# Patient Record
Sex: Male | Born: 1965 | Race: White | Hispanic: No | Marital: Married | Smoking: Former smoker
Health system: Southern US, Community
[De-identification: ages and names within clinical notes are randomized; demographics above are authoritative.]

## PROBLEM LIST (undated history)

## (undated) DIAGNOSIS — F329 Major depressive disorder, single episode, unspecified: Secondary | ICD-10-CM

## (undated) DIAGNOSIS — E7800 Pure hypercholesterolemia, unspecified: Secondary | ICD-10-CM

## (undated) DIAGNOSIS — I10 Essential (primary) hypertension: Secondary | ICD-10-CM

## (undated) DIAGNOSIS — N401 Enlarged prostate with lower urinary tract symptoms: Secondary | ICD-10-CM

## (undated) DIAGNOSIS — N138 Other obstructive and reflux uropathy: Secondary | ICD-10-CM

## (undated) DIAGNOSIS — Z87442 Personal history of urinary calculi: Secondary | ICD-10-CM

## (undated) DIAGNOSIS — F32A Depression, unspecified: Secondary | ICD-10-CM

## (undated) HISTORY — PX: KIDNEY STONE SURGERY: SHX686

## (undated) HISTORY — PX: NO PAST SURGERIES: SHX2092

---

## 2010-07-09 ENCOUNTER — Emergency Department: Payer: Self-pay | Admitting: Emergency Medicine

## 2012-12-22 ENCOUNTER — Emergency Department: Payer: Self-pay | Admitting: Emergency Medicine

## 2012-12-22 LAB — BASIC METABOLIC PANEL
Anion Gap: 9 (ref 7–16)
BUN: 15 mg/dL (ref 7–18)
Calcium, Total: 9 mg/dL (ref 8.5–10.1)
Chloride: 105 mmol/L (ref 98–107)
Co2: 23 mmol/L (ref 21–32)
Glucose: 114 mg/dL — ABNORMAL HIGH (ref 65–99)
Osmolality: 276 (ref 275–301)
Potassium: 4 mmol/L (ref 3.5–5.1)
Sodium: 137 mmol/L (ref 136–145)

## 2012-12-22 LAB — CBC WITH DIFFERENTIAL/PLATELET
Basophil #: 0.1 10*3/uL (ref 0.0–0.1)
Basophil %: 0.6 %
Eosinophil %: 3.1 %
HCT: 48.7 % (ref 40.0–52.0)
Lymphocyte #: 2.4 10*3/uL (ref 1.0–3.6)
MCH: 29.1 pg (ref 26.0–34.0)
MCHC: 34.3 g/dL (ref 32.0–36.0)
MCV: 85 fL (ref 80–100)
Monocyte #: 0.9 x10 3/mm (ref 0.2–1.0)
Monocyte %: 10.6 %
Neutrophil %: 58.6 %
Platelet: 242 10*3/uL (ref 150–440)
RDW: 13.2 % (ref 11.5–14.5)
WBC: 8.9 10*3/uL (ref 3.8–10.6)

## 2012-12-22 LAB — URINALYSIS, COMPLETE
Blood: NEGATIVE
Glucose,UR: NEGATIVE mg/dL (ref 0–75)
Ketone: NEGATIVE
Leukocyte Esterase: NEGATIVE
Nitrite: NEGATIVE
RBC,UR: 2 /HPF (ref 0–5)
Squamous Epithelial: <1
WBC UR: 1 /HPF (ref 0–5)

## 2012-12-22 LAB — CK TOTAL AND CKMB (NOT AT ARMC): CK-MB: 2.7 ng/mL (ref 0.5–3.6)

## 2012-12-22 LAB — TROPONIN I: Troponin-I: <0.02 ng/mL

## 2012-12-23 LAB — DRUG SCREEN, URINE
Cannabinoid 50 Ng, Ur ~~LOC~~: POSITIVE (ref ?–50)
MDMA (Ecstasy)Ur Screen: NEGATIVE (ref ?–500)
Methadone, Ur Screen: NEGATIVE (ref ?–300)
Opiate, Ur Screen: NEGATIVE (ref ?–300)
Phencyclidine (PCP) Ur S: NEGATIVE (ref ?–25)
Tricyclic, Ur Screen: NEGATIVE (ref ?–1000)

## 2014-01-06 ENCOUNTER — Emergency Department (HOSPITAL_COMMUNITY): Payer: 59

## 2014-01-06 ENCOUNTER — Encounter (HOSPITAL_COMMUNITY): Payer: Self-pay | Admitting: *Deleted

## 2014-01-06 ENCOUNTER — Emergency Department (HOSPITAL_COMMUNITY)
Admission: EM | Admit: 2014-01-06 | Discharge: 2014-01-06 | Disposition: A | Discharge status: Home / Self Care | Payer: 59 | Attending: Emergency Medicine | Admitting: Emergency Medicine

## 2014-01-06 DIAGNOSIS — Z8639 Personal history of other endocrine, nutritional and metabolic disease: Secondary | ICD-10-CM | POA: Diagnosis not present

## 2014-01-06 DIAGNOSIS — Z8659 Personal history of other mental and behavioral disorders: Secondary | ICD-10-CM | POA: Diagnosis not present

## 2014-01-06 DIAGNOSIS — Y998 Other external cause status: Secondary | ICD-10-CM | POA: Diagnosis not present

## 2014-01-06 DIAGNOSIS — Z87891 Personal history of nicotine dependence: Secondary | ICD-10-CM | POA: Diagnosis not present

## 2014-01-06 DIAGNOSIS — I10 Essential (primary) hypertension: Secondary | ICD-10-CM | POA: Insufficient documentation

## 2014-01-06 DIAGNOSIS — W01198A Fall on same level from slipping, tripping and stumbling with subsequent striking against other object, initial encounter: Secondary | ICD-10-CM | POA: Diagnosis not present

## 2014-01-06 DIAGNOSIS — Y9389 Activity, other specified: Secondary | ICD-10-CM | POA: Insufficient documentation

## 2014-01-06 DIAGNOSIS — Z87448 Personal history of other diseases of urinary system: Secondary | ICD-10-CM | POA: Insufficient documentation

## 2014-01-06 DIAGNOSIS — Y92015 Private garage of single-family (private) house as the place of occurrence of the external cause: Secondary | ICD-10-CM | POA: Insufficient documentation

## 2014-01-06 DIAGNOSIS — S0990XA Unspecified injury of head, initial encounter: Secondary | ICD-10-CM | POA: Diagnosis present

## 2014-01-06 HISTORY — DX: Depression, unspecified: F32.A

## 2014-01-06 HISTORY — DX: Major depressive disorder, single episode, unspecified: F32.9

## 2014-01-06 HISTORY — DX: Benign prostatic hyperplasia with lower urinary tract symptoms: N13.8

## 2014-01-06 HISTORY — DX: Benign prostatic hyperplasia with lower urinary tract symptoms: N40.1

## 2014-01-06 HISTORY — DX: Pure hypercholesterolemia, unspecified: E78.00

## 2014-01-06 HISTORY — DX: Essential (primary) hypertension: I10

## 2014-01-06 MED ORDER — ONDANSETRON HCL 4 MG/2ML IJ SOLN
4.0000 mg | Freq: Once | INTRAMUSCULAR | Status: AC
  Administered 2014-01-06: 4 mg via INTRAVENOUS
  Filled 2014-01-06: qty 2

## 2014-01-06 MED ORDER — OXYCODONE-ACETAMINOPHEN 5-325 MG PO TABS: 1.0000 | ORAL_TABLET | ORAL | Status: DC | PRN

## 2014-01-06 MED ORDER — FENTANYL CITRATE 0.05 MG/ML IJ SOLN
50.0000 ug | INTRAMUSCULAR | Status: DC | PRN
  Administered 2014-01-06 (×3): 50 ug via INTRAVENOUS
  Filled 2014-01-06 (×3): qty 2

## 2014-01-06 NOTE — ED Notes (Addendum)
Pt arrives via POV driven by wife. States that he fell about 5 ft down off of a refrigerator. States he was sitting on the fridge, he leaned back and the door of the fridge opened and he fell and hit the left side of his head on the cement garage floor. pts wife states that he did not want 911 called. Pt c/o head pain, blurry vision and dizziness. Also c/o jaw pain. c-collar applied. Dr Eulis Foster notified.

## 2014-01-06 NOTE — ED Notes (Signed)
Pt monitored by pulse ox, bp cuff, and 12-lead. 

## 2014-01-06 NOTE — ED Notes (Signed)
Dr. Wentz at bedside. 

## 2014-01-06 NOTE — ED Provider Notes (Signed)
CSN: [id number]     Arrival date & time 01/06/14  1548 History   First MD Initiated Contact with Patient 01/06/14 1558     Chief Complaint  Patient presents with  . Fall     (Consider location/radiation/quality/duration/timing/severity/associated sxs/prior Treatment) Patient is a 48 y.o. male presenting with fall. The history is provided by the patient and the spouse.  Fall  Matthew Small is a 47 y.o. male who was on top of the refrigerator, feet, about 5 feet off the ground, when he accidentally slipped and fell, striking his head on the ground.  He does not feel he lost consciousness.  His wife heard a commotion, came to him and found him awake, but needing help getting up.  He was a with a meal afterwards.  He currently complains of pain in his left temple.  He has had some blurred vision.  He denies nausea, vomiting, neck pain, back pain, weakness or dizziness.  There are no other known modifying factors.    Past Medical History  Diagnosis Date  . Hypertension   . Hypercholesteremia   . Depression   . Prostate hyperplasia with urinary obstruction    History reviewed. No pertinent past surgical history. No family history on file. History  Substance Use Topics  . Smoking status: Former Smoker    Types: Cigarettes    Quit date: 01/07/1984  . Smokeless tobacco: Never Used  . Alcohol Use: 6.6 oz/week    8 Cans of beer, 3 Shots of liquor per week    Review of Systems  All other systems reviewed and are negative.     Allergies  Review of patient's allergies indicates no known allergies.  Home Medications   Prior to Admission medications   Not on File   BP 142/88 mmHg  Pulse 71  Temp(Src) 98.9 F (37.2 C) (Oral)  Resp 24  Ht 5\' 5"  (1.651 m)  Wt 195 lb (88.451 kg)  BMI 32.45 kg/m2  SpO2 93% Physical Exam  Constitutional: He is oriented to person, place, and time. He appears well-developed and well-nourished.  HENT:  Head: Normocephalic.  Right Ear: External  ear normal.  Left Ear: External ear normal.  Small abrasion left frontal scalp, with mild diffuse left frontal scalp tenderness.  No crepitation or deformity.  Eyes: Conjunctivae and EOM are normal. Pupils are equal, round, and reactive to light.  Neck: Normal range of motion and phonation normal. Neck supple.  Cardiovascular: Normal rate, regular rhythm and normal heart sounds.   Pulmonary/Chest: Effort normal and breath sounds normal. He exhibits no bony tenderness.  Abdominal: Soft. There is no tenderness.  Musculoskeletal: Normal range of motion.  Neurological: He is alert and oriented to person, place, and time. No cranial nerve deficit or sensory deficit. He exhibits normal muscle tone. Coordination normal.  Skin: Skin is warm, dry and intact.  Psychiatric: He has a normal mood and affect. His behavior is normal. Judgment and thought content normal.  Nursing note and vitals reviewed.   ED Course  Procedures (including critical care time)  Medications  fentaNYL (SUBLIMAZE) injection 50 mcg (50 mcg Intravenous Given 01/06/14 1652)  ondansetron (ZOFRAN) injection 4 mg (4 mg Intravenous Given 01/06/14 1606)    Patient Vitals for the past 24 hrs:  BP Temp Temp src Pulse Resp SpO2 Height Weight  01/06/14 1615 142/88 mmHg - - 71 24 93 % - -  01/06/14 1600 146/94 mmHg - - 81 16 97 % - -  01/06/14 1553 Marland Kitchen)  161/110 mmHg 98.9 F (37.2 C) Oral 89 14 98 % 5\' 5"  (1.651 m) 195 lb (88.451 kg)    5:29 PM Reevaluation with update and discussion. After initial assessment and treatment, an updated evaluation reveals no further c/o.  Pain improved after second treatment with fentanyl.  Findings discussed with patient, and wife, questions answered. . [place] Review Labs Reviewed - No data to display  Imaging Review No results found.   EKG Interpretation   Date/Time:  Sunday January 06 2014 15:54:40 EST Ventricular Rate:  84 PR Interval:  159 QRS Duration: 99 QT  Interval:  419 QTC Calculation: 495 R Axis:   52 Text Interpretation:  Sinus rhythm Ventricular premature complex  Borderline prolonged QT interval No old tracing to compare Confirmed by  [facility]  MD, Eulan Heyward [phone]) on 01/06/2014 3:58:14 PM      MDM   Final diagnoses:  Head injury, initial encounter    Fall with head injury, isolated trauma.  Possible mild concussion, without serious injury.  Nursing Notes Reviewed/ Care Coordinated Applicable Imaging Reviewed Interpretation of Laboratory Data incorporated into ED treatment  The patient appears reasonably screened and/or stabilized for discharge and I doubt any other medical condition or other [facility] requiring further screening, evaluation, or treatment in the ED at this time prior to discharge.  Plan: Home Medications- Percocet; Home Treatments- rest; return here if the recommended treatment, does not improve the symptoms; Recommended follow up- PCP prn   Richarda Blade, MD 01/06/14 1746

## 2014-01-06 NOTE — Discharge Instructions (Signed)

## 2014-01-07 ENCOUNTER — Emergency Department (HOSPITAL_COMMUNITY)
Admission: EM | Admit: 2014-01-07 | Discharge: 2014-01-08 | Disposition: A | Discharge status: Home / Self Care | Payer: 59 | Attending: Emergency Medicine | Admitting: Emergency Medicine

## 2014-01-07 ENCOUNTER — Emergency Department (HOSPITAL_COMMUNITY): Payer: 59

## 2014-01-07 ENCOUNTER — Encounter (HOSPITAL_COMMUNITY): Payer: Self-pay

## 2014-01-07 DIAGNOSIS — F0781 Postconcussional syndrome: Secondary | ICD-10-CM | POA: Insufficient documentation

## 2014-01-07 DIAGNOSIS — Z87891 Personal history of nicotine dependence: Secondary | ICD-10-CM | POA: Diagnosis not present

## 2014-01-07 DIAGNOSIS — F329 Major depressive disorder, single episode, unspecified: Secondary | ICD-10-CM | POA: Diagnosis not present

## 2014-01-07 DIAGNOSIS — W19XXXA Unspecified fall, initial encounter: Secondary | ICD-10-CM

## 2014-01-07 DIAGNOSIS — Y998 Other external cause status: Secondary | ICD-10-CM | POA: Diagnosis not present

## 2014-01-07 DIAGNOSIS — I10 Essential (primary) hypertension: Secondary | ICD-10-CM | POA: Diagnosis not present

## 2014-01-07 DIAGNOSIS — R51 Headache: Secondary | ICD-10-CM | POA: Insufficient documentation

## 2014-01-07 DIAGNOSIS — Z79899 Other long term (current) drug therapy: Secondary | ICD-10-CM | POA: Insufficient documentation

## 2014-01-07 DIAGNOSIS — W1789XD Other fall from one level to another, subsequent encounter: Secondary | ICD-10-CM | POA: Insufficient documentation

## 2014-01-07 DIAGNOSIS — S8001XA Contusion of right knee, initial encounter: Secondary | ICD-10-CM | POA: Insufficient documentation

## 2014-01-07 DIAGNOSIS — Z87438 Personal history of other diseases of male genital organs: Secondary | ICD-10-CM | POA: Diagnosis not present

## 2014-01-07 DIAGNOSIS — S8002XA Contusion of left knee, initial encounter: Secondary | ICD-10-CM | POA: Insufficient documentation

## 2014-01-07 DIAGNOSIS — E781 Pure hyperglyceridemia: Secondary | ICD-10-CM | POA: Insufficient documentation

## 2014-01-07 DIAGNOSIS — Y9389 Activity, other specified: Secondary | ICD-10-CM | POA: Diagnosis not present

## 2014-01-07 DIAGNOSIS — Y92015 Private garage of single-family (private) house as the place of occurrence of the external cause: Secondary | ICD-10-CM | POA: Diagnosis not present

## 2014-01-07 LAB — CBC WITH DIFFERENTIAL/PLATELET
BASOS PCT: 0 % (ref 0–1)
Basophils Absolute: 0 10*3/uL (ref 0.0–0.1)
Eosinophils Absolute: 0.2 10*3/uL (ref 0.0–0.7)
Eosinophils Relative: 2 % (ref 0–5)
HEMATOCRIT: 47.6 % (ref 39.0–52.0)
Hemoglobin: 16.2 g/dL (ref 13.0–17.0)
LYMPHS ABS: 1.6 10*3/uL (ref 0.7–4.0)
LYMPHS PCT: 16 % (ref 12–46)
MCH: 29.1 pg (ref 26.0–34.0)
MCHC: 34 g/dL (ref 30.0–36.0)
MCV: 85.6 fL (ref 78.0–100.0)
MONOS PCT: 8 % (ref 3–12)
Monocytes Absolute: 0.8 10*3/uL (ref 0.1–1.0)
NEUTROS ABS: 7.6 10*3/uL (ref 1.7–7.7)
NEUTROS PCT: 74 % (ref 43–77)
Platelets: 244 10*3/uL (ref 150–400)
RBC: 5.56 MIL/uL (ref 4.22–5.81)
RDW: 12.8 % (ref 11.5–15.5)
WBC: 10.3 10*3/uL (ref 4.0–10.5)

## 2014-01-07 LAB — COMPREHENSIVE METABOLIC PANEL
ALBUMIN: 4.3 g/dL (ref 3.5–5.2)
ALK PHOS: 103 U/L (ref 39–117)
ALT: 37 U/L (ref 0–53)
ANION GAP: 14 (ref 5–15)
AST: 53 U/L — ABNORMAL HIGH (ref 0–37)
BILIRUBIN TOTAL: 1 mg/dL (ref 0.3–1.2)
BUN: 9 mg/dL (ref 6–23)
CHLORIDE: 98 meq/L (ref 96–112)
CO2: 28 meq/L (ref 19–32)
Calcium: 9.6 mg/dL (ref 8.4–10.5)
Creatinine, Ser: 0.8 mg/dL (ref 0.50–1.35)
GFR calc Af Amer: >90 mL/min (ref >90)
Glucose, Bld: 112 mg/dL — ABNORMAL HIGH (ref 70–99)
POTASSIUM: 4 meq/L (ref 3.7–5.3)
Sodium: 140 mEq/L (ref 137–147)
Total Protein: 7.4 g/dL (ref 6.0–8.3)

## 2014-01-07 MED ORDER — HYDROMORPHONE HCL 1 MG/ML IJ SOLN
0.5000 mg | Freq: Once | INTRAMUSCULAR | Status: AC
  Administered 2014-01-07: 0.5 mg via INTRAVENOUS
  Filled 2014-01-07: qty 1

## 2014-01-07 MED ORDER — SODIUM CHLORIDE 0.9 % IV BOLUS (SEPSIS)
1000.0000 mL | INTRAVENOUS | Status: AC
  Administered 2014-01-07: 1000 mL via INTRAVENOUS

## 2014-01-07 MED ORDER — ONDANSETRON HCL 4 MG/2ML IJ SOLN
4.0000 mg | Freq: Once | INTRAMUSCULAR | Status: AC
  Administered 2014-01-07: 4 mg via INTRAVENOUS
  Filled 2014-01-07: qty 2

## 2014-01-07 MED ORDER — LORAZEPAM 2 MG/ML IJ SOLN
0.5000 mg | Freq: Once | INTRAMUSCULAR | Status: AC
  Administered 2014-01-08: 0.5 mg via INTRAVENOUS
  Filled 2014-01-07: qty 1

## 2014-01-07 NOTE — ED Notes (Addendum)
Pt had fall yesterday and was evaluated here, diagnosed with concussion, today about 10am had headache, followed by n/v starting at 3pm today.  Pt given 4mg  of zofran PTA.

## 2014-01-07 NOTE — ED Provider Notes (Signed)
CSN: [id number]     Arrival date & time 01/07/14  2138 History   First MD Initiated Contact with Patient 01/07/14 2148     Chief Complaint  Patient presents with  . Headache  . Nausea     (Consider location/radiation/quality/duration/timing/severity/associated sxs/prior Treatment) Patient is a 48 y.o. male presenting with headaches and dizziness. The history is provided by the patient.  Headache Pain location:  Frontal Quality:  Dull Radiates to:  Does not radiate Severity currently:  2/10 Onset quality:  Sudden Duration:  1 day Timing:  Constant Progression:  Improving Chronicity:  New Similar to prior headaches: no   Context comment:  Fall Relieved by: oxycodone. Worsened by:  Nothing tried Ineffective treatments:  None tried Associated symptoms: dizziness, nausea and vomiting   Associated symptoms: no abdominal pain, no cough, no diarrhea, no pain, no fever, no neck pain and no numbness   Dizziness Quality:  Unable to specify Severity:  Moderate Onset quality:  Gradual Timing:  Constant Progression:  Worsening Chronicity:  New Context comment:  Hit head yesterday Relieved by:  Nothing Worsened by:  Sitting upright, standing up and turning head Ineffective treatments:  None tried Associated symptoms: headaches, nausea and vomiting   Associated symptoms: no chest pain, no diarrhea and no shortness of breath     Past Medical History  Diagnosis Date  . Hypertension   . Hypercholesteremia   . Depression   . Prostate hyperplasia with urinary obstruction    History reviewed. No pertinent past surgical history. No family history on file. History  Substance Use Topics  . Smoking status: Former Smoker    Types: Cigarettes    Quit date: 01/07/1984  . Smokeless tobacco: Never Used  . Alcohol Use: 6.6 oz/week    8 Cans of beer, 3 Shots of liquor per week    Review of Systems  Constitutional: Negative for fever.  HENT: Negative for drooling and rhinorrhea.    Eyes: Negative for pain.  Respiratory: Negative for cough and shortness of breath.   Cardiovascular: Negative for chest pain and leg swelling.  Gastrointestinal: Positive for nausea and vomiting. Negative for abdominal pain and diarrhea.  Genitourinary: Negative for dysuria and hematuria.  Musculoskeletal: Negative for gait problem and neck pain.  Skin: Negative for color change.  Neurological: Positive for dizziness and headaches. Negative for numbness.  Hematological: Negative for adenopathy.  Psychiatric/Behavioral: Negative for behavioral problems.  All other systems reviewed and are negative.     Allergies  Review of patient's allergies indicates no known allergies.  Home Medications   Prior to Admission medications   Medication Sig Start Date End Date Taking? Authorizing Provider  ALPRAZolam Duanne Moron) 0.25 MG tablet Take 0.25 mg by mouth daily as needed for anxiety.  12/19/13   Historical Provider, MD  atorvastatin (LIPITOR) 20 MG tablet Take 20 mg by mouth daily. 12/25/13   Historical Provider, MD  CIALIS 5 MG tablet Take 5 mg by mouth daily as needed for erectile dysfunction.  12/04/13   Historical Provider, MD  cyclobenzaprine (FLEXERIL) 10 MG tablet Take 10 mg by mouth daily as needed for muscle spasms.  12/13/13   Historical Provider, MD  DULoxetine (CYMBALTA) 60 MG capsule Take 60 mg by mouth daily. 01/02/14   Historical Provider, MD  losartan (COZAAR) 100 MG tablet Take 100 mg by mouth daily. 11/28/13   Historical Provider, MD  oxyCODONE-acetaminophen (PERCOCET) 5-325 MG per tablet Take 1 tablet by mouth every 4 (four) hours as needed for severe pain.  01/06/14   Richarda Blade, MD  tamsulosin (FLOMAX) 0.4 MG CAPS capsule Take 0.4 mg by mouth daily. 01/02/14   Historical Provider, MD  Vitamin D, Ergocalciferol, (DRISDOL) 50000 UNITS CAPS capsule Take 1 capsule by mouth daily. 12/27/13   Historical Provider, MD   BP 156/97 mmHg  Pulse 63  Temp(Src) 97.6 F (36.4 C) (Oral)   Resp 20  Ht 5\' 5"  (1.651 m)  Wt 190 lb (86.183 kg)  BMI 31.62 kg/m2  SpO2 100% Physical Exam  Constitutional: He is oriented to person, place, and time. He appears well-developed and well-nourished.  HENT:  Head: Normocephalic and atraumatic.  Right Ear: External ear normal.  Left Ear: External ear normal.  Nose: Nose normal.  Mouth/Throat: Oropharynx is clear and moist. No oropharyngeal exudate.  Eyes: Conjunctivae and EOM are normal. Pupils are equal, round, and reactive to light.  Neck: Normal range of motion. Neck supple.  Cardiovascular: Normal rate, regular rhythm, normal heart sounds and intact distal pulses.  Exam reveals no gallop and no friction rub.   No murmur heard. Pulmonary/Chest: Effort normal and breath sounds normal. No respiratory distress. He has no wheezes.  Abdominal: Soft. Bowel sounds are normal. He exhibits no distension. There is no tenderness. There is no rebound and no guarding.  Musculoskeletal: Normal range of motion. He exhibits no edema or tenderness.  Mild bruising to bilateral anterior knees without significant tenderness to palpation.  Neurological: He is alert and oriented to person, place, and time.  alert, oriented x3 speech: normal in context and clarity memory: intact grossly cranial nerves II-XII: intact motor strength: full proximally and distally no involuntary movements or tremors sensation: intact to light touch diffusely  cerebellar: finger-to-nose and heel-to-shin intact gait: dizziness w/ standing, pt able to walk forwards and backwards w/out ataxia  Skin: Skin is warm and dry.  Psychiatric: He has a normal mood and affect. His behavior is normal.  Nursing note and vitals reviewed.   ED Course  Procedures (including critical care time) Labs Review Labs Reviewed  COMPREHENSIVE METABOLIC PANEL - Abnormal; Notable for the following:    Glucose, Bld 112 (*)    AST 53 (*)    All other components within normal limits  CBC WITH  DIFFERENTIAL    Imaging Review Ct Head Wo Contrast  01/06/2014   CLINICAL DATA:  Fell 5 feet onto cement garage floor today. Head pain. Blurry vision. Dizziness.  EXAM: CT HEAD WITHOUT CONTRAST  CT CERVICAL SPINE WITHOUT CONTRAST  TECHNIQUE: Multidetector CT imaging of the head and cervical spine was performed following the standard protocol without intravenous contrast. Multiplanar CT image reconstructions of the cervical spine were also generated.  COMPARISON:  None.  FINDINGS: CT HEAD FINDINGS  There is no intra or extra-axial fluid collection or mass lesion. The basilar cisterns and ventricles have a normal appearance. There is no CT evidence for acute infarction or hemorrhage. Bone windows are unremarkable.  CT CERVICAL SPINE FINDINGS  There are degenerative changes in the mid cervical spine. No evidence for acute fracture or subluxation. Alignment is normal. Lung apices are clear.  IMPRESSION: 1.  No evidence for acute intracranial abnormality. 2.  No evidence for acute cervical spine abnormality. 3. Mild cervical spine degenerative changes.   Electronically Signed   By: Shon Hale M.D.   On: 01/06/2014 17:25   Ct Cervical Spine Wo Contrast  01/06/2014   CLINICAL DATA:  [address] 5 feet onto cement garage floor today. Head pain. Blurry vision. Dizziness.  EXAM: CT HEAD WITHOUT CONTRAST  CT CERVICAL SPINE WITHOUT CONTRAST  TECHNIQUE: Multidetector CT imaging of the head and cervical spine was performed following the standard protocol without intravenous contrast. Multiplanar CT image reconstructions of the cervical spine were also generated.  COMPARISON:  None.  FINDINGS: CT HEAD FINDINGS  There is no intra or extra-axial fluid collection or mass lesion. The basilar cisterns and ventricles have a normal appearance. There is no CT evidence for acute infarction or hemorrhage. Bone windows are unremarkable.  CT CERVICAL SPINE FINDINGS  There are degenerative changes in the mid cervical spine. No evidence for  acute fracture or subluxation. Alignment is normal. Lung apices are clear.  IMPRESSION: 1.  No evidence for acute intracranial abnormality. 2.  No evidence for acute cervical spine abnormality. 3. Mild cervical spine degenerative changes.   Electronically Signed   By: Shon Hale M.D.   On: 01/06/2014 17:25     EKG Interpretation None      MDM   Final diagnoses:  Fall  Post concussive syndrome    10:10 PM 48 y.o. male Senior yesterday after mechanical fall off a bridge in his garage. He had the left frontal area of his head and believes he had loss of consciousness. He had a noncontributory CT of his head and neck yesterday. He notes his pain has improved considerably. He currently rates his pain 1-2 out of 10. He states that he has had worsening nausea and dizziness today. He has also had several episodes of vomiting. He is afebrile and vital signs are unremarkable here. He notes his dizziness to be worse with ambulation. He had a CT scan of his head yesterday within just a few hours of his injury and it is unlikely that a subarachnoid hemorrhage was missed. Given worsening symptoms will get repeat CT of head. Will get symptom control as well. I suspect he has a postconcussive syndrome.  12:12 AM: I interpreted/reviewed the labs and/or imaging which were non-contributory.  Sx improved. The patient continues to have dizziness with standing but he is ambulatory without ataxia. His symptoms are likely related to postconcussive syndrome. We'll provide a prescription for meclizine and Zofran for home. We'll provide a referral to neurology.  I have discussed the diagnosis/risks/treatment options with the patient and family and believe the pt to be eligible for discharge home to follow-up with neurology. We also discussed returning to the ED immediately if new or worsening sx occur. We discussed the sx which are most concerning (e.g., worsening HA, intractable vomiting, ataxia, weakness, numbness) that  necessitate immediate return. Medications administered to the patient during their visit and any new prescriptions provided to the patient are listed below.  Medications given during this visit Medications  sodium chloride 0.9 % bolus 1,000 mL (0 mLs Intravenous Stopped 01/08/14 0007)  ondansetron (ZOFRAN) injection 4 mg (4 mg Intravenous Given 01/07/14 2248)  HYDROmorphone (DILAUDID) injection 0.5 mg (0.5 mg Intravenous Given 01/07/14 2249)  LORazepam (ATIVAN) injection 0.5 mg (0.5 mg Intravenous Given 01/08/14 0008)    New Prescriptions   MECLIZINE (ANTIVERT) 50 MG TABLET    Take 1 tablet (50 mg total) by mouth 3 (three) times daily as needed for dizziness.   ONDANSETRON (ZOFRAN ODT) 4 MG DISINTEGRATING TABLET    4mg  ODT q4 hours prn nausea/vomit     Pamella Pert, MD 01/08/14 [id number]

## 2014-01-08 MED ORDER — ONDANSETRON 4 MG PO TBDP: ORAL_TABLET | ORAL | Status: DC

## 2014-01-08 MED ORDER — MECLIZINE HCL 50 MG PO TABS: 50.0000 mg | ORAL_TABLET | Freq: Three times a day (TID) | ORAL | Status: DC | PRN

## 2014-01-08 NOTE — Discharge Instructions (Signed)
Concussion  A concussion, or closed-head injury, is a brain injury caused by a direct blow to the head or by a quick and sudden movement (jolt) of the head or neck. Concussions are usually not life-threatening. Even so, the effects of a concussion can be serious. If you have had a concussion before, you are more likely to experience concussion-like symptoms after a direct blow to the head.   CAUSES  · Direct blow to the head, such as from running into another player during a soccer game, being hit in a fight, or hitting your head on a hard surface.  · A jolt of the head or neck that causes the brain to move back and forth inside the skull, such as in a car crash.  SIGNS AND SYMPTOMS  The signs of a concussion can be hard to notice. Early on, they may be missed by you, family members, and health care providers. You may look fine but act or feel differently.  Symptoms are usually temporary, but they may last for days, weeks, or even longer. Some symptoms may appear right away while others may not show up for hours or days. Every head injury is different. Symptoms include:  · Mild to moderate headaches that will not go away.  · A feeling of pressure inside your head.  · Having more trouble than usual:  ¨ Learning or remembering things you have heard.  ¨ Answering questions.  ¨ Paying attention or concentrating.  ¨ Organizing daily tasks.  ¨ Making decisions and solving problems.  · Slowness in thinking, acting or reacting, speaking, or reading.  · Getting lost or being easily confused.  · Feeling tired all the time or lacking energy (fatigued).  · Feeling drowsy.  · Sleep disturbances.  ¨ Sleeping more than usual.  ¨ Sleeping less than usual.  ¨ Trouble falling asleep.  ¨ Trouble sleeping (insomnia).  · Loss of balance or feeling lightheaded or dizzy.  · Nausea or vomiting.  · Numbness or tingling.  · Increased sensitivity to:  ¨ Sounds.  ¨ Lights.  ¨ Distractions.  · Vision problems or eyes that tire  easily.  · Diminished sense of taste or smell.  · Ringing in the ears.  · Mood changes such as feeling sad or anxious.  · Becoming easily irritated or angry for little or no reason.  · Lack of motivation.  · Seeing or hearing things other people do not see or hear (hallucinations).  DIAGNOSIS  Your health care provider can usually diagnose a concussion based on a description of your injury and symptoms. He or she will ask whether you passed out (lost consciousness) and whether you are having trouble remembering events that happened right before and during your injury.  Your evaluation might include:  · A brain scan to look for signs of injury to the brain. Even if the test shows no injury, you may still have a concussion.  · Blood tests to be sure other problems are not present.  TREATMENT  · Concussions are usually treated in an emergency department, in urgent care, or at a clinic. You may need to stay in the hospital overnight for further treatment.  · Tell your health care provider if you are taking any medicines, including prescription medicines, over-the-counter medicines, and natural remedies. Some medicines, such as blood thinners (anticoagulants) and aspirin, may increase the chance of complications. Also tell your health care provider whether you have had alcohol or are taking illegal drugs. This information   may affect treatment.  · Your health care provider will send you home with important instructions to follow.  · How fast you will recover from a concussion depends on many factors. These factors include how severe your concussion is, what part of your brain was injured, your age, and how healthy you were before the concussion.  · Most people with mild injuries recover fully. Recovery can take time. In general, recovery is slower in older persons. Also, persons who have had a concussion in the past or have other medical problems may find that it takes longer to recover from their current injury.  HOME  CARE INSTRUCTIONS  General Instructions  · Carefully follow the directions your health care provider gave you.  · Only take over-the-counter or prescription medicines for pain, discomfort, or fever as directed by your health care provider.  · Take only those medicines that your health care provider has approved.  · Do not drink alcohol until your health care provider says you are well enough to do so. Alcohol and certain other drugs may slow your recovery and can put you at risk of further injury.  · If it is harder than usual to remember things, write them down.  · If you are easily distracted, try to do one thing at a time. For example, do not try to watch TV while fixing dinner.  · Talk with family members or close friends when making important decisions.  · Keep all follow-up appointments. Repeated evaluation of your symptoms is recommended for your recovery.  · Watch your symptoms and tell others to do the same. Complications sometimes occur after a concussion. Older adults with a brain injury may have a higher risk of serious complications, such as a blood clot on the brain.  · Tell your teachers, school nurse, school counselor, coach, athletic trainer, or work manager about your injury, symptoms, and restrictions. Tell them about what you can or cannot do. They should watch for:  ¨ Increased problems with attention or concentration.  ¨ Increased difficulty remembering or learning new information.  ¨ Increased time needed to complete tasks or assignments.  ¨ Increased irritability or decreased ability to cope with stress.  ¨ Increased symptoms.  · Rest. Rest helps the brain to heal. Make sure you:  ¨ Get plenty of sleep at night. Avoid staying up late at night.  ¨ Keep the same bedtime hours on weekends and weekdays.  ¨ Rest during the day. Take daytime naps or rest breaks when you feel tired.  · Limit activities that require a lot of thought or concentration. These include:  ¨ Doing homework or job-related  work.  ¨ Watching TV.  ¨ Working on the computer.  · Avoid any situation where there is potential for another head injury (football, hockey, soccer, basketball, martial arts, downhill snow sports and horseback riding). Your condition will get worse every time you experience a concussion. You should avoid these activities until you are evaluated by the appropriate follow-up health care providers.  Returning To Your Regular Activities  You will need to return to your normal activities slowly, not all at once. You must give your body and brain enough time for recovery.  · Do not return to sports or other athletic activities until your health care provider tells you it is safe to do so.  · Ask your health care provider when you can drive, ride a bicycle, or operate heavy machinery. Your ability to react may be slower after a   brain injury. Never do these activities if you are dizzy.  · Ask your health care provider about when you can return to work or school.  Preventing Another Concussion  It is very important to avoid another brain injury, especially before you have recovered. In rare cases, another injury can lead to permanent brain damage, brain swelling, or death. The risk of this is greatest during the first 7-10 days after a head injury. Avoid injuries by:  · Wearing a seat belt when riding in a car.  · Drinking alcohol only in moderation.  · Wearing a helmet when biking, skiing, skateboarding, skating, or doing similar activities.  · Avoiding activities that could lead to a second concussion, such as contact or recreational sports, until your health care provider says it is okay.  · Taking safety measures in your home.  ¨ Remove clutter and tripping hazards from floors and stairways.  ¨ Use grab bars in bathrooms and handrails by stairs.  ¨ Place non-slip mats on floors and in bathtubs.  ¨ Improve lighting in dim areas.  SEEK MEDICAL CARE IF:  · You have increased problems paying attention or  concentrating.  · You have increased difficulty remembering or learning new information.  · You need more time to complete tasks or assignments than before.  · You have increased irritability or decreased ability to cope with stress.  · You have more symptoms than before.  Seek medical care if you have any of the following symptoms for more than 2 weeks after your injury:  · Lasting (chronic) headaches.  · Dizziness or balance problems.  · Nausea.  · Vision problems.  · Increased sensitivity to noise or light.  · Depression or mood swings.  · Anxiety or irritability.  · Memory problems.  · Difficulty concentrating or paying attention.  · Sleep problems.  · Feeling tired all the time.  SEEK IMMEDIATE MEDICAL CARE IF:  · You have severe or worsening headaches. These may be a sign of a blood clot in the brain.  · You have weakness (even if only in one hand, leg, or part of the face).  · You have numbness.  · You have decreased coordination.  · You vomit repeatedly.  · You have increased sleepiness.  · One pupil is larger than the other.  · You have convulsions.  · You have slurred speech.  · You have increased confusion. This may be a sign of a blood clot in the brain.  · You have increased restlessness, agitation, or irritability.  · You are unable to recognize people or places.  · You have neck pain.  · It is difficult to wake you up.  · You have unusual behavior changes.  · You lose consciousness.  MAKE SURE YOU:  · Understand these instructions.  · Will watch your condition.  · Will get help right away if you are not doing well or get worse.  Document Released: 04/03/2003 Document Revised: 01/16/2013 Document Reviewed: 08/03/2012  [employer]® Patient Information ©2015 [employer]. This information is not intended to replace advice given to you by your health care provider. Make sure you discuss any questions you have with your health care provider.

## 2014-10-22 ENCOUNTER — Emergency Department
Admission: EM | Admit: 2014-10-22 | Discharge: 2014-10-22 | Disposition: A | Discharge status: Home / Self Care | Payer: Self-pay | Attending: Emergency Medicine | Admitting: Emergency Medicine

## 2014-10-22 DIAGNOSIS — Z87891 Personal history of nicotine dependence: Secondary | ICD-10-CM | POA: Insufficient documentation

## 2014-10-22 DIAGNOSIS — N200 Calculus of kidney: Secondary | ICD-10-CM | POA: Insufficient documentation

## 2014-10-22 DIAGNOSIS — Z79899 Other long term (current) drug therapy: Secondary | ICD-10-CM | POA: Insufficient documentation

## 2014-10-22 DIAGNOSIS — I10 Essential (primary) hypertension: Secondary | ICD-10-CM | POA: Insufficient documentation

## 2014-10-22 LAB — CBC WITH DIFFERENTIAL/PLATELET
BASOS ABS: 0 10*3/uL (ref 0–0.1)
Basophils Relative: 0 %
EOS PCT: 1 %
Eosinophils Absolute: 0.1 10*3/uL (ref 0–0.7)
HCT: 48.1 % (ref 40.0–52.0)
HEMOGLOBIN: 15.7 g/dL (ref 13.0–18.0)
LYMPHS PCT: 12 %
Lymphs Abs: 1.6 10*3/uL (ref 1.0–3.6)
MCH: 27.9 pg (ref 26.0–34.0)
MCHC: 32.7 g/dL (ref 32.0–36.0)
MCV: 85.3 fL (ref 80.0–100.0)
Monocytes Absolute: 0.8 10*3/uL (ref 0.2–1.0)
Monocytes Relative: 6 %
NEUTROS ABS: 10.8 10*3/uL — AB (ref 1.4–6.5)
Neutrophils Relative %: 81 %
PLATELETS: 228 10*3/uL (ref 150–440)
RBC: 5.64 MIL/uL (ref 4.40–5.90)
RDW: 13.5 % (ref 11.5–14.5)
WBC: 13.4 10*3/uL — AB (ref 3.8–10.6)

## 2014-10-22 LAB — BASIC METABOLIC PANEL
Anion gap: 10 (ref 5–15)
BUN: 22 mg/dL — ABNORMAL HIGH (ref 6–20)
CALCIUM: 9.6 mg/dL (ref 8.9–10.3)
CHLORIDE: 102 mmol/L (ref 101–111)
CO2: 28 mmol/L (ref 22–32)
CREATININE: 1.03 mg/dL (ref 0.61–1.24)
GFR calc Af Amer: >60 mL/min (ref >60)
GFR calc non Af Amer: >60 mL/min (ref >60)
Glucose, Bld: 121 mg/dL — ABNORMAL HIGH (ref 65–99)
Potassium: 4.2 mmol/L (ref 3.5–5.1)
Sodium: 140 mmol/L (ref 135–145)

## 2014-10-22 LAB — URINALYSIS COMPLETE WITH MICROSCOPIC (ARMC ONLY)
BACTERIA UA: NONE SEEN
Bilirubin Urine: NEGATIVE
GLUCOSE, UA: NEGATIVE mg/dL
Leukocytes, UA: NEGATIVE
Nitrite: NEGATIVE
Protein, ur: NEGATIVE mg/dL
SPECIFIC GRAVITY, URINE: 1.019 (ref 1.005–1.030)
pH: 6 (ref 5.0–8.0)

## 2014-10-22 MED ORDER — ONDANSETRON HCL 4 MG/2ML IJ SOLN
4.0000 mg | Freq: Once | INTRAMUSCULAR | Status: AC
  Administered 2014-10-22: 4 mg via INTRAVENOUS

## 2014-10-22 MED ORDER — ONDANSETRON HCL 4 MG PO TABS: 4.0000 mg | ORAL_TABLET | Freq: Every day | ORAL | Status: DC | PRN

## 2014-10-22 MED ORDER — OXYCODONE-ACETAMINOPHEN 5-325 MG PO TABS: 1.0000 | ORAL_TABLET | ORAL | Status: DC | PRN

## 2014-10-22 MED ORDER — MORPHINE SULFATE (PF) 4 MG/ML IV SOLN
INTRAVENOUS | Status: AC
  Filled 2014-10-22: qty 1

## 2014-10-22 MED ORDER — TAMSULOSIN HCL 0.4 MG PO CAPS: 0.4000 mg | ORAL_CAPSULE | Freq: Every day | ORAL | Status: DC

## 2014-10-22 MED ORDER — SODIUM CHLORIDE 0.9 % IV BOLUS (SEPSIS)
1000.0000 mL | Freq: Once | INTRAVENOUS | Status: AC
  Administered 2014-10-22: 1000 mL via INTRAVENOUS

## 2014-10-22 MED ORDER — MORPHINE SULFATE (PF) 4 MG/ML IV SOLN
4.0000 mg | Freq: Once | INTRAVENOUS | Status: AC
Start: 2014-10-22 — End: 2014-10-22
  Administered 2014-10-22: 4 mg via INTRAVENOUS

## 2014-10-22 MED ORDER — KETOROLAC TROMETHAMINE 30 MG/ML IJ SOLN
30.0000 mg | Freq: Once | INTRAMUSCULAR | Status: AC
  Administered 2014-10-22: 30 mg via INTRAVENOUS

## 2014-10-22 MED ORDER — ONDANSETRON HCL 4 MG/2ML IJ SOLN
INTRAMUSCULAR | Status: AC
  Filled 2014-10-22: qty 2

## 2014-10-22 MED ORDER — KETOROLAC TROMETHAMINE 30 MG/ML IJ SOLN
INTRAMUSCULAR | Status: AC
  Filled 2014-10-22: qty 1

## 2014-10-22 NOTE — ED Notes (Signed)
Pt comes into the ED via EMS from home with c/o sudden onset right flank pain with N/V.Marland Kitchen

## 2014-10-22 NOTE — ED Notes (Signed)
Pt reports that morphine helped pain "a little." Pt observed resting quietly on stretcher with lights dimmed in room. NS infusing at this time. Will continue to monitor.

## 2014-10-22 NOTE — ED Notes (Signed)
Pt comes into the ED via EMS from home with c/o sudden onset right flank pain with N/V. Pt ambulated without difficulty to cpod tx room.  Awaiting ua speciman and cbc.results. Marland Kitchen

## 2014-10-22 NOTE — ED Notes (Signed)
Pt given written and verbal discharge instructions. Verbalized understanding.

## 2014-10-22 NOTE — ED Provider Notes (Signed)
[facility] Emergency Department Provider Note    ____________________________________________  Time seen: 1017  I have reviewed the triage vital signs and the nursing notes.   HISTORY  Chief Complaint Flank Pain   History limited by: Not Limited   HPI Matthew Small is a 49 y.o. male who presents to the emergency department today with right flank pain. He states that this happened suddenly. It started this morning. He was at work. The pain has since been constant. It is severe. He denies any radiation. He has had associated nausea and vomiting. He denies similar pain in the past. He does state that he was diagnosed with kidney stones earlier this year he had a CT scan. Denies any fevers.    Past Medical History  Diagnosis Date  . Hypertension   . Hypercholesteremia   . Depression   . Prostate hyperplasia with urinary obstruction     There are no active problems to display for this patient.   History reviewed. No pertinent past surgical history.  Current Outpatient Rx  Name  Route  Sig  Dispense  Refill  . ALPRAZolam (XANAX) 0.25 MG tablet   Oral   Take 0.25 mg by mouth daily as needed for anxiety.       1   . atorvastatin (LIPITOR) 20 MG tablet   Oral   Take 20 mg by mouth daily.      3   . CIALIS 5 MG tablet   Oral   Take 5 mg by mouth daily as needed for erectile dysfunction.       11   . cyclobenzaprine (FLEXERIL) 10 MG tablet   Oral   Take 10 mg by mouth daily as needed for muscle spasms.       1   . DULoxetine (CYMBALTA) 60 MG capsule   Oral   Take 60 mg by mouth daily.      1   . losartan (COZAAR) 100 MG tablet   Oral   Take 100 mg by mouth daily.      0   . meclizine (ANTIVERT) 50 MG tablet   Oral   Take 1 tablet (50 mg total) by mouth 3 (three) times daily as needed for dizziness.   30 tablet   0   . ondansetron (ZOFRAN ODT) 4 MG disintegrating tablet      4mg  ODT q4 hours prn nausea/vomit   25 tablet    0   . oxyCODONE-acetaminophen (PERCOCET) 5-325 MG per tablet   Oral   Take 1 tablet by mouth every 4 (four) hours as needed for severe pain.   20 tablet   0   . tamsulosin (FLOMAX) 0.4 MG CAPS capsule   Oral   Take 0.4 mg by mouth daily.      11   . Vitamin D, Ergocalciferol, (DRISDOL) 50000 UNITS CAPS capsule   Oral   Take 1 capsule by mouth daily.      0     Allergies Review of patient's allergies indicates no known allergies.  No family history on file.  Social History Social History  Substance Use Topics  . Smoking status: Former Smoker    Types: Cigarettes    Quit date: 01/07/1984  . Smokeless tobacco: Never Used  . Alcohol Use: 6.6 oz/week    8 Cans of beer, 3 Shots of liquor per week    Review of Systems  Constitutional: Negative for fever. Cardiovascular: Negative for chest pain. Respiratory: Negative for shortness  of breath. Gastrointestinal: Positive for right flank pain. Nausea and vomiting. Genitourinary: Negative for dysuria. Musculoskeletal: Negative for back pain. Skin: Negative for rash. Neurological: Negative for headaches, focal weakness or numbness.   10-point ROS otherwise negative.  ____________________________________________   PHYSICAL EXAM:  VITAL SIGNS: ED Triage Vitals  Enc Vitals Group     BP 10/22/14 1233 157/128 mmHg     Pulse Rate 10/22/14 1233 63     Resp 10/22/14 1233 22     Temp 10/22/14 1231 97.8 F (36.6 C)     Temp Source 10/22/14 1231 Oral     SpO2 10/22/14 1233 100 %     Weight 10/22/14 1231 180 lb (81.647 kg)     Height 10/22/14 1231 5\' 5"  (1.651 m)     Head Cir --      Peak Flow --      Pain Score 10/22/14 1232 10   Constitutional: Alert and oriented. Appears uncomfortable. Eyes: Conjunctivae are normal. PERRL. Normal extraocular movements. ENT   Head: Normocephalic and atraumatic.   Nose: No congestion/rhinnorhea.   Mouth/Throat: Mucous membranes are moist.   Neck: No  stridor. Hematological/Lymphatic/Immunilogical: No cervical lymphadenopathy. Cardiovascular: Normal rate, regular rhythm.  No murmurs, rubs, or gallops. Respiratory: Normal respiratory effort without tachypnea nor retractions. Breath sounds are clear and equal bilaterally. No wheezes/rales/rhonchi. Gastrointestinal: Soft and nontender. No distention. Positive right-sided CVA tenderness. Genitourinary: Deferred Musculoskeletal: Normal range of motion in all extremities. No joint effusions.  No lower extremity tenderness nor edema. Neurologic:  Normal speech and language. No gross focal neurologic deficits are appreciated. Speech is normal.  Skin:  Skin is warm, dry and intact. No rash noted. Psychiatric: Mood and affect are normal. Speech and behavior are normal. Patient exhibits appropriate insight and judgment.  ____________________________________________    LABS (pertinent positives/negatives)  Labs Reviewed  BASIC METABOLIC PANEL - Abnormal; Notable for the following:    Glucose, Bld 121 (*)    BUN 22 (*)    All other components within normal limits  URINALYSIS COMPLETEWITH MICROSCOPIC ([facility] ONLY) - Abnormal; Notable for the following:    Color, Urine YELLOW (*)    APPearance CLEAR (*)    Ketones, ur TRACE (*)    Hgb urine dipstick 2+ (*)    Squamous Epithelial / LPF 0-5 (*)    All other components within normal limits  CBC WITH DIFFERENTIAL/PLATELET - Abnormal; Notable for the following:    WBC 13.4 (*)    Neutro Abs 10.8 (*)    All other components within normal limits     ____________________________________________   EKG  None  ____________________________________________    RADIOLOGY  Bedside US with mild hydronephrosis of the right kidney ____________________________________________   PROCEDURES  Procedure(s) performed: None  Critical Care performed: No  ____________________________________________   INITIAL IMPRESSION / ASSESSMENT AND PLAN / ED  COURSE  Pertinent labs & imaging results that were available during my care of the patient were reviewed by me and considered in my medical decision making (see chart for details).  Patient presents to the emergency department today with concerns for right flank pain. Urine does have hematuria. Blood work without any signs of concerning decreased renal function. Mild leukocytosis of the blood work however no white blood cells in the urine. I doubt infection especially given patient afebrile. Patient was given pain medication here and discharged after he was made comfortable. Discussed kidney stone return cautions.  ____________________________________________   FINAL CLINICAL IMPRESSION(S) / ED DIAGNOSES  Final diagnoses:  Kidney stone     Nance Pear, MD 10/22/14 [phone]

## 2014-10-22 NOTE — ED Notes (Signed)
Pt is resting quietly with eyes closed and lights dimmed. Pt's wife reports that pt has been asleep since receiving toradol. No acute distress noted. Will continue to monitor.

## 2014-10-22 NOTE — Discharge Instructions (Signed)
Please seek medical attention for any high fevers, chest pain, shortness of breath, change in behavior, persistent vomiting, bloody stool or any other new or concerning symptoms. ° ° °Kidney Stones °Kidney stones (urolithiasis) are deposits that form inside your kidneys. The intense pain is caused by the stone moving through the urinary tract. When the stone moves, the ureter goes into spasm around the stone. The stone is usually passed in the urine.  °CAUSES  °· A disorder that makes certain neck glands produce too much parathyroid hormone (primary hyperparathyroidism). °· A buildup of uric acid crystals, similar to gout in your joints. °· Narrowing (stricture) of the ureter. °· A kidney obstruction present at birth (congenital obstruction). °· Previous surgery on the kidney or ureters. °· Numerous kidney infections. °SYMPTOMS  °· Feeling sick to your stomach (nauseous). °· Throwing up (vomiting). °· Blood in the urine (hematuria). °· Pain that usually spreads (radiates) to the groin. °· Frequency or urgency of urination. °DIAGNOSIS  °· Taking a history and physical exam. °· Blood or urine tests. °· CT scan. °· Occasionally, an examination of the inside of the urinary bladder (cystoscopy) is performed. °TREATMENT  °· Observation. °· Increasing your fluid intake. °· Extracorporeal shock wave lithotripsy--This is a noninvasive procedure that uses shock waves to break up kidney stones. °· Surgery may be needed if you have severe pain or persistent obstruction. There are various surgical procedures. Most of the procedures are performed with the use of small instruments. Only small incisions are needed to accommodate these instruments, so recovery time is minimized. °The size, location, and chemical composition are all important variables that will determine the proper choice of action for you. Talk to your health care provider to better understand your situation so that you will minimize the risk of injury to yourself  and your kidney.  °HOME CARE INSTRUCTIONS  °· Drink enough water and fluids to keep your urine clear or pale yellow. This will help you to pass the stone or stone fragments. °· Strain all urine through the provided strainer. Keep all particulate matter and stones for your health care provider to see. The stone causing the pain may be as small as a grain of salt. It is very important to use the strainer each and every time you pass your urine. The collection of your stone will allow your health care provider to analyze it and verify that a stone has actually passed. The stone analysis will often identify what you can do to reduce the incidence of recurrences. °· Only take over-the-counter or prescription medicines for pain, discomfort, or fever as directed by your health care provider. °· Make a follow-up appointment with your health care provider as directed. °· Get follow-up X-rays if required. The absence of pain does not always mean that the stone has passed. It may have only stopped moving. If the urine remains completely obstructed, it can cause loss of kidney function or even complete destruction of the kidney. It is your responsibility to make sure X-rays and follow-ups are completed. Ultrasounds of the kidney can show blockages and the status of the kidney. Ultrasounds are not associated with any radiation and can be performed easily in a matter of minutes. °SEEK MEDICAL CARE IF: °· You experience pain that is progressive and unresponsive to any pain medicine you have been prescribed. °SEEK IMMEDIATE MEDICAL CARE IF:  °· Pain cannot be controlled with the prescribed medicine. °· You have a fever or shaking chills. °· The severity or intensity   of pain increases over 18 hours and is not relieved by pain medicine. °· You develop a new onset of abdominal pain. °· You feel faint or pass out. °· You are unable to urinate. °MAKE SURE YOU:  °· Understand these instructions. °· Will watch your condition. °· Will get  help right away if you are not doing well or get worse. °Document Released: 01/11/2005 Document Revised: 09/13/2012 Document Reviewed: 06/14/2012 °[employer]® Patient Information ©2015 [employer]. This information is not intended to replace advice given to you by your health care provider. Make sure you discuss any questions you have with your health care provider. ° °

## 2015-10-03 ENCOUNTER — Emergency Department (HOSPITAL_COMMUNITY)
Admission: EM | Admit: 2015-10-03 | Discharge: 2015-10-03 | Disposition: A | Discharge status: Home / Self Care | Payer: Non-veteran care | Attending: Emergency Medicine | Admitting: Emergency Medicine

## 2015-10-03 ENCOUNTER — Encounter (HOSPITAL_COMMUNITY): Payer: Self-pay | Admitting: Emergency Medicine

## 2015-10-03 ENCOUNTER — Emergency Department (HOSPITAL_COMMUNITY): Payer: Non-veteran care

## 2015-10-03 DIAGNOSIS — Z79899 Other long term (current) drug therapy: Secondary | ICD-10-CM | POA: Diagnosis not present

## 2015-10-03 DIAGNOSIS — I10 Essential (primary) hypertension: Secondary | ICD-10-CM | POA: Insufficient documentation

## 2015-10-03 DIAGNOSIS — R339 Retention of urine, unspecified: Secondary | ICD-10-CM | POA: Diagnosis not present

## 2015-10-03 DIAGNOSIS — Z87891 Personal history of nicotine dependence: Secondary | ICD-10-CM | POA: Diagnosis not present

## 2015-10-03 LAB — LIPASE, BLOOD: Lipase: 35 U/L (ref 11–51)

## 2015-10-03 LAB — CBC WITH DIFFERENTIAL/PLATELET
BASOS ABS: 0 10*3/uL (ref 0.0–0.1)
Basophils Relative: 0 %
Eosinophils Absolute: 0.3 10*3/uL (ref 0.0–0.7)
Eosinophils Relative: 4 %
HEMATOCRIT: 44.1 % (ref 39.0–52.0)
HEMOGLOBIN: 14.8 g/dL (ref 13.0–17.0)
Lymphocytes Relative: 28 %
Lymphs Abs: 2.3 10*3/uL (ref 0.7–4.0)
MCH: 28.7 pg (ref 26.0–34.0)
MCHC: 33.6 g/dL (ref 30.0–36.0)
MCV: 85.6 fL (ref 78.0–100.0)
MONO ABS: 0.9 10*3/uL (ref 0.1–1.0)
Monocytes Relative: 11 %
NEUTROS ABS: 4.7 10*3/uL (ref 1.7–7.7)
Neutrophils Relative %: 57 %
PLATELETS: 241 10*3/uL (ref 150–400)
RBC: 5.15 MIL/uL (ref 4.22–5.81)
RDW: 12.9 % (ref 11.5–15.5)
WBC: 8.2 10*3/uL (ref 4.0–10.5)

## 2015-10-03 LAB — URINALYSIS, ROUTINE W REFLEX MICROSCOPIC
Bilirubin Urine: NEGATIVE
GLUCOSE, UA: NEGATIVE mg/dL
Hgb urine dipstick: NEGATIVE
Ketones, ur: NEGATIVE mg/dL
LEUKOCYTES UA: NEGATIVE
Nitrite: NEGATIVE
PROTEIN: NEGATIVE mg/dL
Specific Gravity, Urine: 1.023 (ref 1.005–1.030)
pH: 5.5 (ref 5.0–8.0)

## 2015-10-03 LAB — COMPREHENSIVE METABOLIC PANEL
ALBUMIN: 3.9 g/dL (ref 3.5–5.0)
ALT: 39 U/L (ref 17–63)
ANION GAP: 8 (ref 5–15)
AST: 35 U/L (ref 15–41)
Alkaline Phosphatase: 87 U/L (ref 38–126)
BUN: 16 mg/dL (ref 6–20)
CHLORIDE: 109 mmol/L (ref 101–111)
CO2: 24 mmol/L (ref 22–32)
Calcium: 9.5 mg/dL (ref 8.9–10.3)
Creatinine, Ser: 0.79 mg/dL (ref 0.61–1.24)
GFR calc Af Amer: >60 mL/min (ref >60)
GFR calc non Af Amer: >60 mL/min (ref >60)
GLUCOSE: 105 mg/dL — AB (ref 65–99)
POTASSIUM: 3.5 mmol/L (ref 3.5–5.1)
SODIUM: 141 mmol/L (ref 135–145)
TOTAL PROTEIN: 6.8 g/dL (ref 6.5–8.1)
Total Bilirubin: 0.8 mg/dL (ref 0.3–1.2)

## 2015-10-03 MED ORDER — MORPHINE SULFATE (PF) 4 MG/ML IV SOLN
4.0000 mg | Freq: Once | INTRAVENOUS | Status: AC
  Administered 2015-10-03: 4 mg via INTRAMUSCULAR
  Filled 2015-10-03: qty 1

## 2015-10-03 MED ORDER — POLYETHYLENE GLYCOL 3350 17 G PO PACK: 17.0000 g | PACK | Freq: Every day | ORAL | 0 refills | Status: AC

## 2015-10-03 NOTE — ED Notes (Signed)
Pt standard bag changed to a leg bag. Pt also given water for a fluid challenge.

## 2015-10-03 NOTE — ED Provider Notes (Signed)
[facility] DEPT Provider Note   CSN: [id number] Arrival date & time: 10/03/15  0855     History   Chief Complaint Chief Complaint  Patient presents with  . Abdominal Pain  . Urinary Retention  . Constipation    HPI Burtis Roscoe is a 50 y.o. male.  Patient presents with a six-day history of progressive worsening lower abdominal pain that is constant. He states he has not been able to urinate or have a bowel movement. He gave himself some prune juice 3 days ago and had some diarrhea but has not had a bowel movement since then. He is still passing gas. He states is only able to urinate small amounts and is just dribbling. Has a history of prostate issues but takes tamsulosin. Denies vomiting. Denies fever. Endorses dysuria. No testicular pain. No chest pain or shortness of breath. He has never had any abdominal surgeries. Has had Kidney stone the past but this feels different. He states he has had a Foley catheter before but only in the setting of his kidney stone. Still has a good appetite and wants to eat.   The history is provided by the patient.  Abdominal Pain   Associated symptoms include constipation. Pertinent negatives include fever, nausea, vomiting, dysuria, hematuria, headaches, arthralgias and myalgias.  Constipation   Associated symptoms include abdominal pain. Pertinent negatives include no dysuria.    Past Medical History:  Diagnosis Date  . Depression   . Hypercholesteremia   . Hypertension   . Prostate hyperplasia with urinary obstruction     There are no active problems to display for this patient.   History reviewed. No pertinent surgical history.     Home Medications    Prior to Admission medications   Medication Sig Start Date End Date Taking? Authorizing Provider  ALPRAZolam Duanne Moron) 0.25 MG tablet Take 0.25 mg by mouth daily as needed for anxiety.  12/19/13   Historical Provider, MD  atorvastatin (LIPITOR) 20 MG tablet Take 20 mg by mouth  daily. 12/25/13   Historical Provider, MD  CIALIS 5 MG tablet Take 5 mg by mouth daily as needed for erectile dysfunction.  12/04/13   Historical Provider, MD  cyclobenzaprine (FLEXERIL) 10 MG tablet Take 10 mg by mouth daily as needed for muscle spasms.  12/13/13   Historical Provider, MD  DULoxetine (CYMBALTA) 60 MG capsule Take 60 mg by mouth daily. 01/02/14   Historical Provider, MD  losartan (COZAAR) 100 MG tablet Take 100 mg by mouth daily. 11/28/13   Historical Provider, MD  meclizine (ANTIVERT) 50 MG tablet Take 1 tablet (50 mg total) by mouth 3 (three) times daily as needed for dizziness. 01/08/14   Pamella Pert, MD  ondansetron (ZOFRAN ODT) 4 MG disintegrating tablet 4mg  ODT q4 hours prn nausea/vomit 01/08/14   Pamella Pert, MD  ondansetron (ZOFRAN) 4 MG tablet Take 1 tablet (4 mg total) by mouth daily as needed for nausea or vomiting. 10/22/14   Nance Pear, MD  oxyCODONE-acetaminophen (ROXICET) 5-325 MG tablet Take 1 tablet by mouth every 4 (four) hours as needed for severe pain. 10/22/14   Nance Pear, MD  tamsulosin (FLOMAX) 0.4 MG CAPS capsule Take 1 capsule (0.4 mg total) by mouth daily. 10/22/14   Nance Pear, MD  Vitamin D, Ergocalciferol, (DRISDOL) 50000 UNITS CAPS capsule Take 1 capsule by mouth daily. 12/27/13   Historical Provider, MD    Family History No family history on file.  Social History Social History  Substance Use Topics  . Smoking  status: Former Smoker    Types: Cigarettes    Quit date: 01/07/1984  . Smokeless tobacco: Never Used  . Alcohol use 6.6 oz/week    8 Cans of beer, 3 Shots of liquor per week     Allergies   Review of patient's allergies indicates no known allergies.   Review of Systems Review of Systems  Constitutional: Negative for activity change, appetite change and fever.  HENT: Negative for congestion.   Eyes: Negative for visual disturbance.  Respiratory: Negative for cough, chest tightness and shortness of breath.     Cardiovascular: Negative for chest pain.  Gastrointestinal: Positive for abdominal pain and constipation. Negative for nausea and vomiting.  Genitourinary: Positive for decreased urine volume and difficulty urinating. Negative for dysuria, flank pain, hematuria and testicular pain.  Musculoskeletal: Negative for arthralgias and myalgias.  Neurological: Negative for dizziness, weakness, light-headedness and headaches.  A complete 10 system review of systems was obtained and all systems are negative except as noted in the HPI and PMH.     Physical Exam Updated Vital Signs BP 136/95 (BP Location: Left Arm)   Pulse 87   Temp 98 F (36.7 C) (Oral)   Resp 18   Ht 5\' 5"  (1.651 m)   Wt 170 lb (77.1 kg)   SpO2 97%   BMI 28.29 kg/m   Physical Exam  Constitutional: He is oriented to person, place, and time. He appears well-developed and well-nourished. No distress.  uncomfortable  HENT:  Head: Normocephalic and atraumatic.  Mouth/Throat: Oropharynx is clear and moist. No oropharyngeal exudate.  Eyes: Conjunctivae and EOM are normal. Pupils are equal, round, and reactive to light.  Neck: Normal range of motion. Neck supple.  No meningismus.  Cardiovascular: Normal rate, regular rhythm, normal heart sounds and intact distal pulses.   No murmur heard. Pulmonary/Chest: Effort normal and breath sounds normal. No respiratory distress.  Abdominal: Soft. There is tenderness. There is no rebound and no guarding.  Palpable bladder. TTP to lower abdomen  Genitourinary:  Genitourinary Comments: No testicular pain  Musculoskeletal: Normal range of motion. He exhibits no edema or tenderness.  Neurological: He is alert and oriented to person, place, and time. No cranial nerve deficit. He exhibits normal muscle tone. Coordination normal.  No ataxia on finger to nose bilaterally. No pronator drift. 5/5 strength throughout. CN 2-12 intact.Equal grip strength. Sensation intact.   Skin: Skin is warm.  Capillary refill takes less than 2 seconds.  Psychiatric: He has a normal mood and affect. His behavior is normal.  Nursing note and vitals reviewed.    ED Treatments / Results  Labs (all labs ordered are listed, but only abnormal results are displayed) Labs Reviewed  COMPREHENSIVE METABOLIC PANEL - Abnormal; Notable for the following:       Result Value   Glucose, Bld 105 (*)    All other components within normal limits  URINALYSIS, ROUTINE W REFLEX MICROSCOPIC (NOT AT [facility])  CBC WITH DIFFERENTIAL/PLATELET  LIPASE, BLOOD    EKG  EKG Interpretation None       Radiology Dg Abdomen Acute W/chest  Result Date: 10/03/2015 CLINICAL DATA:  Abdominal pain for 6 days EXAM: DG ABDOMEN ACUTE W/ 1V CHEST COMPARISON:  CT abdomen and pelvis December 23, 2012 FINDINGS: PA chest: Lungs are clear. Heart size and pulmonary vascularity are normal. No adenopathy. No bone lesions. Supine and upright abdomen: There is moderate stool throughout the colon. There is no bowel dilatation or air-fluid level to suggest bowel obstruction. No  free air. Calcifications in the pelvis most likely represent phleboliths. IMPRESSION: Moderate stool throughout colon. No bowel obstruction or free air. No lung edema or consolidation. Electronically Signed   By: Lowella Grip III M.D.   On: 10/03/2015 11:24    Procedures Procedures (including critical care time)  Medications Ordered in ED Medications  morphine 4 MG/ML injection 4 mg (not administered)     Initial Impression / Assessment and Plan / ED Course  I have reviewed the triage vital signs and the nursing notes.  Pertinent labs & imaging results that were available during my care of the patient were reviewed by me and considered in my medical decision making (see chart for details).  Clinical Course  Lower abdominal pain, urinary retention.  Bladder scan with >700 ml. Foley catheter placed.  Foley catheter placed patient feels much better.  Abdomen is soft and nontender. Creatinine is normal. Urinalysis is negative.  X-ray does not show any bowel obstruction. Patient will be discharged with Foley catheter in place and bowel regimen. Follow-up with urology. Return precautions discussed. Continue tamsulosin.   Final Clinical Impressions(s) / ED Diagnoses   Final diagnoses:  Urinary retention    New Prescriptions New Prescriptions   No medications on file     Ezequiel Essex, MD 10/03/15 2100

## 2015-10-03 NOTE — Discharge Instructions (Signed)
Follow up with the urologist. Return to the ED if you develop new or worsening symptoms. °

## 2015-10-03 NOTE — ED Notes (Signed)
Pt is in stable condition upon d/c and ambulates from ED. 

## 2015-10-03 NOTE — ED Triage Notes (Signed)
C/o abd pain started Saturday-- states happens after drinking some beers-- has not been able to urinate without "pushing really hard-- due to prostate-- I can't slleo, I did have a bm yesterday after drinking prune juice." states abd feels hard and swollen.

## 2015-10-10 ENCOUNTER — Inpatient Hospital Stay (HOSPITAL_COMMUNITY)
Admission: EM | Admit: 2015-10-10 | Discharge: 2015-10-12 | DRG: 698 | Disposition: A | Discharge status: Home / Self Care | Payer: Non-veteran care | Attending: Internal Medicine | Admitting: Internal Medicine

## 2015-10-10 ENCOUNTER — Encounter (HOSPITAL_COMMUNITY): Payer: Self-pay | Admitting: Emergency Medicine

## 2015-10-10 ENCOUNTER — Emergency Department (HOSPITAL_COMMUNITY): Payer: Non-veteran care

## 2015-10-10 ENCOUNTER — Inpatient Hospital Stay (HOSPITAL_COMMUNITY): Payer: Non-veteran care

## 2015-10-10 DIAGNOSIS — R079 Chest pain, unspecified: Secondary | ICD-10-CM | POA: Diagnosis present

## 2015-10-10 DIAGNOSIS — Y738 Miscellaneous gastroenterology and urology devices associated with adverse incidents, not elsewhere classified: Secondary | ICD-10-CM | POA: Diagnosis present

## 2015-10-10 DIAGNOSIS — R110 Nausea: Secondary | ICD-10-CM

## 2015-10-10 DIAGNOSIS — I159 Secondary hypertension, unspecified: Secondary | ICD-10-CM

## 2015-10-10 DIAGNOSIS — N12 Tubulo-interstitial nephritis, not specified as acute or chronic: Secondary | ICD-10-CM | POA: Diagnosis present

## 2015-10-10 DIAGNOSIS — A419 Sepsis, unspecified organism: Secondary | ICD-10-CM | POA: Diagnosis present

## 2015-10-10 DIAGNOSIS — N390 Urinary tract infection, site not specified: Secondary | ICD-10-CM

## 2015-10-10 DIAGNOSIS — R109 Unspecified abdominal pain: Secondary | ICD-10-CM

## 2015-10-10 DIAGNOSIS — I10 Essential (primary) hypertension: Secondary | ICD-10-CM | POA: Diagnosis present

## 2015-10-10 DIAGNOSIS — F419 Anxiety disorder, unspecified: Secondary | ICD-10-CM | POA: Diagnosis present

## 2015-10-10 DIAGNOSIS — D72829 Elevated white blood cell count, unspecified: Secondary | ICD-10-CM | POA: Diagnosis present

## 2015-10-10 DIAGNOSIS — T83511A Infection and inflammatory reaction due to indwelling urethral catheter, initial encounter: Principal | ICD-10-CM | POA: Diagnosis present

## 2015-10-10 DIAGNOSIS — R1084 Generalized abdominal pain: Secondary | ICD-10-CM

## 2015-10-10 DIAGNOSIS — R319 Hematuria, unspecified: Secondary | ICD-10-CM | POA: Diagnosis present

## 2015-10-10 DIAGNOSIS — F329 Major depressive disorder, single episode, unspecified: Secondary | ICD-10-CM | POA: Diagnosis present

## 2015-10-10 DIAGNOSIS — R509 Fever, unspecified: Secondary | ICD-10-CM

## 2015-10-10 DIAGNOSIS — N400 Enlarged prostate without lower urinary tract symptoms: Secondary | ICD-10-CM | POA: Diagnosis present

## 2015-10-10 DIAGNOSIS — Z87891 Personal history of nicotine dependence: Secondary | ICD-10-CM | POA: Diagnosis not present

## 2015-10-10 DIAGNOSIS — E7800 Pure hypercholesterolemia, unspecified: Secondary | ICD-10-CM | POA: Diagnosis present

## 2015-10-10 DIAGNOSIS — D729 Disorder of white blood cells, unspecified: Secondary | ICD-10-CM

## 2015-10-10 DIAGNOSIS — E872 Acidosis, unspecified: Secondary | ICD-10-CM | POA: Diagnosis present

## 2015-10-10 DIAGNOSIS — I959 Hypotension, unspecified: Secondary | ICD-10-CM | POA: Diagnosis present

## 2015-10-10 HISTORY — DX: Personal history of urinary calculi: Z87.442

## 2015-10-10 LAB — URINALYSIS, ROUTINE W REFLEX MICROSCOPIC
BILIRUBIN URINE: NEGATIVE
GLUCOSE, UA: NEGATIVE mg/dL
KETONES UR: NEGATIVE mg/dL
NITRITE: NEGATIVE
PH: 5.5 (ref 5.0–8.0)
Protein, ur: NEGATIVE mg/dL
SPECIFIC GRAVITY, URINE: 1.016 (ref 1.005–1.030)

## 2015-10-10 LAB — COMPREHENSIVE METABOLIC PANEL
ALBUMIN: 4.2 g/dL (ref 3.5–5.0)
ALBUMIN: 3.4 g/dL — AB (ref 3.5–5.0)
ALT: 31 U/L (ref 17–63)
ALT: 23 U/L (ref 17–63)
ANION GAP: 11 (ref 5–15)
AST: 28 U/L (ref 15–41)
AST: 19 U/L (ref 15–41)
Alkaline Phosphatase: 104 U/L (ref 38–126)
Alkaline Phosphatase: 76 U/L (ref 38–126)
Anion gap: 5 (ref 5–15)
BILIRUBIN TOTAL: 1 mg/dL (ref 0.3–1.2)
BUN: 19 mg/dL (ref 6–20)
BUN: 10 mg/dL (ref 6–20)
CALCIUM: 9.4 mg/dL (ref 8.9–10.3)
CHLORIDE: 107 mmol/L (ref 101–111)
CO2: 20 mmol/L — ABNORMAL LOW (ref 22–32)
CO2: 23 mmol/L (ref 22–32)
CREATININE: 0.77 mg/dL (ref 0.61–1.24)
Calcium: 8.2 mg/dL — ABNORMAL LOW (ref 8.9–10.3)
Chloride: 103 mmol/L (ref 101–111)
Creatinine, Ser: 0.89 mg/dL (ref 0.61–1.24)
GFR calc Af Amer: >60 mL/min (ref >60)
GLUCOSE: 112 mg/dL — AB (ref 65–99)
GLUCOSE: 105 mg/dL — AB (ref 65–99)
POTASSIUM: 3.8 mmol/L (ref 3.5–5.1)
POTASSIUM: 3.5 mmol/L (ref 3.5–5.1)
SODIUM: 135 mmol/L (ref 135–145)
Sodium: 134 mmol/L — ABNORMAL LOW (ref 135–145)
TOTAL PROTEIN: 6.9 g/dL (ref 6.5–8.1)
Total Bilirubin: 1.3 mg/dL — ABNORMAL HIGH (ref 0.3–1.2)
Total Protein: 5.7 g/dL — ABNORMAL LOW (ref 6.5–8.1)

## 2015-10-10 LAB — URINE MICROSCOPIC-ADD ON

## 2015-10-10 LAB — I-STAT CG4 LACTIC ACID, ED
LACTIC ACID, VENOUS: 3.17 mmol/L — AB (ref 0.5–1.9)
Lactic Acid, Venous: 1.13 mmol/L (ref 0.5–1.9)

## 2015-10-10 LAB — HIV ANTIBODY (ROUTINE TESTING W REFLEX): HIV Screen 4th Generation wRfx: NONREACTIVE

## 2015-10-10 LAB — CBC WITH DIFFERENTIAL/PLATELET
BASOS ABS: 0 10*3/uL (ref 0.0–0.1)
BASOS ABS: 0 10*3/uL (ref 0.0–0.1)
BASOS PCT: 0 %
BASOS PCT: 0 %
EOS PCT: 0 %
Eosinophils Absolute: 0.1 10*3/uL (ref 0.0–0.7)
Eosinophils Absolute: 0.1 10*3/uL (ref 0.0–0.7)
Eosinophils Relative: 0 %
HCT: 39.1 % (ref 39.0–52.0)
HEMATOCRIT: 45.5 % (ref 39.0–52.0)
HEMOGLOBIN: 15.8 g/dL (ref 13.0–17.0)
Hemoglobin: 13.1 g/dL (ref 13.0–17.0)
LYMPHS PCT: 4 %
LYMPHS PCT: 10 %
Lymphs Abs: 1 10*3/uL (ref 0.7–4.0)
Lymphs Abs: 2.4 10*3/uL (ref 0.7–4.0)
MCH: 29.8 pg (ref 26.0–34.0)
MCH: 28.7 pg (ref 26.0–34.0)
MCHC: 34.7 g/dL (ref 30.0–36.0)
MCHC: 33.5 g/dL (ref 30.0–36.0)
MCV: 85.7 fL (ref 78.0–100.0)
MCV: 85.7 fL (ref 78.0–100.0)
Monocytes Absolute: 1.6 10*3/uL — ABNORMAL HIGH (ref 0.1–1.0)
Monocytes Absolute: 2.2 10*3/uL — ABNORMAL HIGH (ref 0.1–1.0)
Monocytes Relative: 7 %
Monocytes Relative: 9 %
NEUTROS ABS: 21.3 10*3/uL — AB (ref 1.7–7.7)
NEUTROS PCT: 89 %
Neutro Abs: 18.7 10*3/uL — ABNORMAL HIGH (ref 1.7–7.7)
Neutrophils Relative %: 80 %
PLATELETS: 205 10*3/uL (ref 150–400)
Platelets: 256 10*3/uL (ref 150–400)
RBC: 5.31 MIL/uL (ref 4.22–5.81)
RBC: 4.56 MIL/uL (ref 4.22–5.81)
RDW: 13 % (ref 11.5–15.5)
RDW: 13.1 % (ref 11.5–15.5)
WBC: 24 10*3/uL — AB (ref 4.0–10.5)
WBC: 23.3 10*3/uL — AB (ref 4.0–10.5)

## 2015-10-10 LAB — I-STAT TROPONIN, ED: Troponin i, poc: 0 ng/mL (ref 0.00–0.08)

## 2015-10-10 LAB — MRSA PCR SCREENING: MRSA BY PCR: NEGATIVE

## 2015-10-10 LAB — LACTIC ACID, PLASMA
Lactic Acid, Venous: 1.1 mmol/L (ref 0.5–1.9)
Lactic Acid, Venous: 1.1 mmol/L (ref 0.5–1.9)

## 2015-10-10 LAB — APTT: APTT: 30 s (ref 24–36)

## 2015-10-10 LAB — PROTIME-INR
INR: 1.16
PROTHROMBIN TIME: 14.8 s (ref 11.4–15.2)

## 2015-10-10 LAB — RPR: RPR: NONREACTIVE

## 2015-10-10 LAB — PROCALCITONIN: Procalcitonin: 0.13 ng/mL

## 2015-10-10 LAB — LIPASE, BLOOD: LIPASE: 30 U/L (ref 11–51)

## 2015-10-10 MED ORDER — ADULT MULTIVITAMIN W/MINERALS CH
1.0000 | ORAL_TABLET | Freq: Every day | ORAL | Status: DC
  Administered 2015-10-10 – 2015-10-12 (×3): 1 via ORAL
  Filled 2015-10-10 (×3): qty 1

## 2015-10-10 MED ORDER — SODIUM CHLORIDE 0.9 % IV BOLUS (SEPSIS)
1000.0000 mL | Freq: Once | INTRAVENOUS | Status: AC
  Administered 2015-10-10: 1000 mL via INTRAVENOUS

## 2015-10-10 MED ORDER — ASPIRIN 81 MG PO CHEW
324.0000 mg | CHEWABLE_TABLET | Freq: Once | ORAL | Status: AC
  Administered 2015-10-10: 324 mg via ORAL
  Filled 2015-10-10: qty 4

## 2015-10-10 MED ORDER — TAMSULOSIN HCL 0.4 MG PO CAPS
0.4000 mg | ORAL_CAPSULE | Freq: Every day | ORAL | Status: DC
  Administered 2015-10-10 – 2015-10-12 (×3): 0.4 mg via ORAL
  Filled 2015-10-10 (×3): qty 1

## 2015-10-10 MED ORDER — DEXTROSE 5 % IV SOLN
1.0000 g | Freq: Three times a day (TID) | INTRAVENOUS | Status: DC
  Administered 2015-10-10 – 2015-10-12 (×6): 1 g via INTRAVENOUS
  Filled 2015-10-10 (×7): qty 1

## 2015-10-10 MED ORDER — MORPHINE SULFATE (PF) 4 MG/ML IV SOLN
4.0000 mg | Freq: Once | INTRAVENOUS | Status: AC
  Administered 2015-10-10: 4 mg via INTRAVENOUS
  Filled 2015-10-10: qty 1

## 2015-10-10 MED ORDER — ENOXAPARIN SODIUM 40 MG/0.4ML ~~LOC~~ SOLN
40.0000 mg | SUBCUTANEOUS | Status: DC
  Administered 2015-10-10 – 2015-10-11 (×2): 40 mg via SUBCUTANEOUS
  Filled 2015-10-10 (×2): qty 0.4

## 2015-10-10 MED ORDER — HYDROCODONE-ACETAMINOPHEN 5-325 MG PO TABS: 1.0000 | ORAL_TABLET | ORAL | Status: DC | PRN

## 2015-10-10 MED ORDER — SODIUM CHLORIDE 0.9 % IV SOLN
INTRAVENOUS | Status: DC
  Administered 2015-10-10: 18:00:00 via INTRAVENOUS

## 2015-10-10 MED ORDER — ONDANSETRON HCL 4 MG/2ML IJ SOLN
4.0000 mg | Freq: Once | INTRAMUSCULAR | Status: AC
  Administered 2015-10-10: 4 mg via INTRAVENOUS
  Filled 2015-10-10: qty 2

## 2015-10-10 MED ORDER — DULOXETINE HCL 60 MG PO CPEP
90.0000 mg | ORAL_CAPSULE | Freq: Every day | ORAL | Status: DC
  Administered 2015-10-10 – 2015-10-12 (×3): 90 mg via ORAL
  Filled 2015-10-10 (×3): qty 1

## 2015-10-10 MED ORDER — ATORVASTATIN CALCIUM 20 MG PO TABS
20.0000 mg | ORAL_TABLET | Freq: Every day | ORAL | Status: DC
  Administered 2015-10-10 – 2015-10-12 (×3): 20 mg via ORAL
  Filled 2015-10-10 (×3): qty 1

## 2015-10-10 MED ORDER — SODIUM CHLORIDE 0.9 % IV BOLUS (SEPSIS)
500.0000 mL | Freq: Once | INTRAVENOUS | Status: AC
  Administered 2015-10-10: 500 mL via INTRAVENOUS

## 2015-10-10 MED ORDER — SODIUM CHLORIDE 0.9% FLUSH
3.0000 mL | Freq: Two times a day (BID) | INTRAVENOUS | Status: DC
  Administered 2015-10-10 – 2015-10-11 (×2): 3 mL via INTRAVENOUS

## 2015-10-10 MED ORDER — POLYETHYLENE GLYCOL 3350 17 G PO PACK
17.0000 g | PACK | Freq: Every day | ORAL | Status: DC
  Administered 2015-10-11: 17 g via ORAL
  Filled 2015-10-10 (×2): qty 1

## 2015-10-10 MED ORDER — ACETAMINOPHEN 325 MG PO TABS
650.0000 mg | ORAL_TABLET | Freq: Four times a day (QID) | ORAL | Status: DC | PRN
  Administered 2015-10-10 – 2015-10-11 (×2): 650 mg via ORAL
  Filled 2015-10-10 (×2): qty 2

## 2015-10-10 MED ORDER — ONDANSETRON HCL 4 MG PO TABS: 4.0000 mg | ORAL_TABLET | Freq: Four times a day (QID) | ORAL | Status: DC | PRN

## 2015-10-10 MED ORDER — HYDROMORPHONE HCL 1 MG/ML IJ SOLN
1.0000 mg | INTRAMUSCULAR | Status: DC | PRN
  Administered 2015-10-10 – 2015-10-12 (×9): 1 mg via INTRAVENOUS
  Filled 2015-10-10 (×9): qty 1

## 2015-10-10 MED ORDER — ACETAMINOPHEN 325 MG PO TABS
650.0000 mg | ORAL_TABLET | Freq: Once | ORAL | Status: AC
  Administered 2015-10-10: 650 mg via ORAL
  Filled 2015-10-10: qty 2

## 2015-10-10 MED ORDER — DEXTROSE 5 % IV SOLN: 2.0000 g | INTRAVENOUS | Status: DC

## 2015-10-10 MED ORDER — FAMOTIDINE IN NACL 20-0.9 MG/50ML-% IV SOLN
20.0000 mg | Freq: Once | INTRAVENOUS | Status: AC
  Administered 2015-10-10: 20 mg via INTRAVENOUS
  Filled 2015-10-10: qty 50

## 2015-10-10 MED ORDER — DEXTROSE 5 % IV SOLN
2.0000 g | Freq: Once | INTRAVENOUS | Status: AC
  Administered 2015-10-10: 2 g via INTRAVENOUS
  Filled 2015-10-10: qty 2

## 2015-10-10 MED ORDER — IOPAMIDOL (ISOVUE-300) INJECTION 61%
INTRAVENOUS | Status: AC
  Administered 2015-10-10: 100 mL
  Filled 2015-10-10: qty 100

## 2015-10-10 MED ORDER — ONDANSETRON HCL 4 MG/2ML IJ SOLN
4.0000 mg | Freq: Four times a day (QID) | INTRAMUSCULAR | Status: DC | PRN
  Administered 2015-10-12: 4 mg via INTRAVENOUS
  Filled 2015-10-10: qty 2

## 2015-10-10 MED ORDER — SODIUM CHLORIDE 0.9 % IV SOLN
1000.0000 mL | INTRAVENOUS | Status: DC
  Administered 2015-10-10: 1000 mL via INTRAVENOUS

## 2015-10-10 MED ORDER — FENTANYL CITRATE (PF) 100 MCG/2ML IJ SOLN
50.0000 ug | Freq: Once | INTRAMUSCULAR | Status: AC
  Administered 2015-10-10: 50 ug via INTRAVENOUS
  Filled 2015-10-10: qty 2

## 2015-10-10 MED ORDER — ACETAMINOPHEN 650 MG RE SUPP: 650.0000 mg | Freq: Four times a day (QID) | RECTAL | Status: DC | PRN

## 2015-10-10 NOTE — H&P (Signed)
History and Physical        Hospital Admission Note Date: 10/10/2015  Patient name: Matthew Small Medical record number: [id number] Date of birth: 1965/03/05 Age: 50 y.o. Gender: male  PCP: [facility], Dr Herold Harms   Referring physician: Ms Camprubi-Soms, [facility]   Patient coming from: home    Chief Complaint:  Abdominal pain, chest pain, back pain, fevers and chills since this morning  HPI: Patient is a 50 year old male with depression, hyperlipidemia, hypertension, nephrolithiasis, BPH and does intermittent self-catheterization presented to ED with fevers, chills pain everywhere since 3 AM. Per patient he was diagnosed with significant BPH 3 years ago and since then he has been self cathing himself. He was running out of the catheters hence was rationing them out in the last 6 weeks, was using same catheters again and again and old catheters. This morning, patient woke up with fevers and chills, temp of 102 at home, abdominal pain, back pain and chest pain. He described the chest pain as all over, constant with nausea and associated with back pain and abdominal pain. He complained of nausea but no vomiting. Per patient, he also noticed hematuria and passage of small clots today otherwise denied any dysuria or malodorous urine. Per patient he does not have appointment till November to see his PCP and hence could not get more refills on his catheter supplies.  ED work-up/course:  At the time of arrival, temperature 100, tachycardiac 103, BP lowest 107/57, O2 sats 95 200% on room air CT abdomen and pelvis showed severe bladder wall thickening which may be secondary to under distention versus cystitis versus bladder outlet obstruction  Review of Systems: Positives marked in 'bold' Constitutional: Please see history of present illness  HEENT: Denies photophobia, eye pain,  redness, hearing loss, ear pain, congestion, sore throat, rhinorrhea, sneezing, mouth sores, trouble swallowing, neck pain, neck stiffness and tinnitus.   Respiratory: Denies SOB, DOE, cough, and wheezing. Patient described chest tightness all over but no associated symptoms of radiation of the chest pain, dizziness, lightheadedness or diaphoresis    Cardiovascular: Denies chest pain, palpitations and leg swelling.  Gastrointestinal: Denies nausea, diarrhea, constipation, blood in stool and abdominal distention. + nausea, diffuse abdominal pain, back pain  Genitourinary:Please see history of present illness  Musculoskeletal: Denies myalgias, joint swelling, arthralgias and gait problem.  Skin: Denies pallor, rash and wound.  Neurological: Denies dizziness, seizures, syncope,  light-headedness, numbness and headaches. Overall feeling weak.  Hematological: Denies adenopathy. Easy bruising, personal or family bleeding history  Psychiatric/Behavioral: Denies suicidal ideation, mood changes, confusion, nervousness, sleep disturbance and agitation  Past Medical History: Past Medical History:  Diagnosis Date  . Depression   . Hypercholesteremia   . Hypertension   . Prostate hyperplasia with urinary obstruction     History reviewed. No pertinent surgical history.  Medications: Prior to Admission medications   Medication Sig Start Date End Date Taking? Authorizing Provider  atorvastatin (LIPITOR) 40 MG tablet Take 20 mg by mouth daily.   Yes Historical Provider, MD  DULoxetine (CYMBALTA) 30 MG capsule Take 90 mg by mouth daily.   Yes Historical Provider, MD  losartan (COZAAR) 100 MG tablet Take 100 mg  by mouth daily. 11/28/13  Yes Historical Provider, MD  Multiple Vitamin (MULTIVITAMIN WITH MINERALS) TABS tablet Take 1 tablet by mouth daily. Megan Men   Yes Historical Provider, MD  polyethylene glycol (MIRALAX) packet Take 17 g by mouth daily. 10/03/15  Yes Ezequiel Essex, MD  tamsulosin (FLOMAX)  0.4 MG CAPS capsule Take 1 capsule (0.4 mg total) by mouth daily. Patient taking differently: Take 0.8 mg by mouth daily.  10/22/14  Yes Nance Pear, MD  ondansetron (ZOFRAN ODT) 4 MG disintegrating tablet 73m ODT q4 hours prn nausea/vomit Patient not taking: Reported on 10/03/2015 01/08/14   FPamella Pert MD    Allergies:  No Known Allergies  Social History:  reports that he quit smoking about 31 years ago. His smoking use included Cigarettes. He has never used smokeless tobacco. He reports that he drinks about 6.6 oz of alcohol per week . He reports that he uses drugs, including Marijuana, about 1 time per week.  Family History: No history of nephrolithiasis or renal cancer  Physical Exam: Blood pressure 118/78, pulse 88, temperature 99 F (37.2 C), temperature source Oral, resp. rate 25, height 5' 5"  (1.651 m), weight 81.2 kg (179 lb), SpO2 97 %. General: Alert, awake, oriented x3, in no acute distress, having chills during the examination . HEENT: normocephalic, atraumatic, anicteric sclera, pink conjunctiva, pupils equal and reactive to light and accomodation, oropharynx clear Neck: supple, no masses or lymphadenopathy, no goiter, no bruits  Heart: Regular rate and rhythm, without murmurs, rubs or gallops. Lungs: Clear to auscultation bilaterally, no wheezing, rales or rhonchi. no chest wall tenderness  Abdomen: Soft, very mild diffuse tenderness, nondistended, positive bowel sounds, no masses, mostly CVAT bilaterally. Extremities: No clubbing, cyanosis or edema with positive pedal pulses. Neuro: Grossly intact, no focal neurological deficits, strength 5/5 upper and lower extremities bilaterally Psych: alert and oriented x 3, normal mood and affect Skin: no rashes or lesions, warm and dry   LABS on Admission:  Basic Metabolic Panel:  Recent Labs Lab 10/10/15 0843  NA 134*  K 3.8  CL 103  CO2 20*  GLUCOSE 112*  BUN 19  CREATININE 0.89  CALCIUM 9.4   Liver  Function Tests:  Recent Labs Lab 10/10/15 0843  AST 28  ALT 31  ALKPHOS 104  BILITOT 1.0  PROT 6.9  ALBUMIN 4.2    Recent Labs Lab 10/10/15 0843  LIPASE 30   No results for input(s): AMMONIA in the last 168 hours. CBC:  Recent Labs Lab 10/10/15 0843  WBC 24.0*  NEUTROABS 21.3*  HGB 15.8  HCT 45.5  MCV 85.7  PLT 256   Cardiac Enzymes: No results for input(s): CKTOTAL, CKMB, CKMBINDEX, TROPONINI in the last 168 hours. BNP: Invalid input(s): POCBNP CBG: No results for input(s): GLUCAP in the last 168 hours.  Radiological Exams on Admission:  No results found.  *I have personally reviewed the images above*  EKG: Independently reviewed. Rate 117, Sinus tachycardia, QTC 479    Assessment/Plan Principal Problem:   Sepsis ([place] due to UTI, self-catheterization with old catheters - Patient met sepsis criteria with fever, tachycardia, lactic acidosis, urinary source, probable pyelonephritis - Placed on IV cefepime with pharmacy consult, patient has received Rocephin 1 in the ED - Follow blood cultures, urine cultures, aggressive IV fluid hydration -  Spoke with urology, Dr. HLouis Meckel recommended continue Foley catheter for 2 weeks, voiding trial at their office in 2 weeks, continue antibiotics for 2 week course. - CT abdomen and pelvis reviewed, no  hydronephrosis or renal stone or abscess, consistent with cystitis or bladder outlet obstruction.   Active Problems:    BPH (benign prostatic hypertrophy) - Continue Flomax    Hematuria - Continue IV fluids and Foley catheter    Lactic acidosis -Likely due to #1, continue IV fluid hydration, lactic acid improving     Leukocytosis - Likely due to #1, follow blood cultures, urine cultures, continue IV cefepime    Hypotension with history of hypertension - Currently hypotensive, hold losartan - Continue aggressive IV fluid hydration    Anxiety - Continue Cymbalta  Atypical chest pain, abdominal pain, back  pain - Likely due to pyelonephritis, #1. The troponin negative, EKG with no acute ischemic changes. Currently no chest pain.  DVT prophylaxis: Lovenox  CODE STATUS: Full CODE STATUS  Consults called: Urology  Family Communication: Admission, patients condition and plan of care including tests being ordered have been discussed with the patient and wife who indicates understanding and agree with the plan and Code Status  Admission status:   Disposition plan: Further plan will depend as patient's clinical course evolves and further radiologic and laboratory data become available. Likely home  At the time of admission, it appears that the appropriate admission status for this patient is INPATIENT . This is judged to be reasonable and necessary in order to provide the required intensity of service to ensure the patient's safety given the presenting symptoms, physical exam findings, and initial radiographic and laboratory data in the context of their chronic comorbidities. The following factors support the admission status of inpatient:   A. The patient's presenting symptoms fever, chills, hypotension, tachycardia with hematuria, abdominal/back pain  B. The worrisome physical exam findings include abdominal tenderness with CVAT.  C. The initial radiographic and laboratory data are worrisome because of bladder outlet obstruction, possible pyelonephritis  D. The chronic co-morbidities include  BPH, hypertension  E. Patient requires inpatient status  due to high intensity of service, high risk for further deterioration and high frequency of surveillance required because of this acute illness that poses a threat to life or bodily function. F. I certify that at the point of admission it is my clinical judgment that the patient will require inpatient hospital care spanning beyond 2 midnights from the point of admission.    Time Spent on Admission: 72mns   Monice Lundy M.D. [facility] 10/10/2015, 2:52 PM Pager: [phone] If 7PM-7AM, please contact night-coverage [email or web address] Password [email or web address]

## 2015-10-10 NOTE — Progress Notes (Signed)
Pharmacy Antibiotic Note  Matthew Small is a 50 y.o. male admitted on 10/10/2015 with sepsis/UTI.  Pharmacy has been consulted for ceftriaxone dosing. Code sepsis called at 0853. Tmax 100.9, LA 3.17.  Ceftriaxone 2g ordered x 1 dose in the ED.  Plan: Ceftriaxone 2g IV q24h - de-escalate dose when sepsis resolved Monitor clinical progress, c/s, LOT Not renally adjusted, Pharmacy will sign off consult  Height: 5\' 5"  (165.1 cm) IBW/kg (Calculated) : 61.5  Temp (24hrs), Avg:100.8 F (38.2 C), Min:100.6 F (38.1 C), Max:100.9 F (38.3 C)   Recent Labs Lab 10/03/15 0930 10/10/15 0856  WBC 8.2  --   CREATININE 0.79  --   LATICACIDVEN  --  3.17*    Estimated Creatinine Clearance: 105.8 mL/min (by C-G formula based on SCr of 0.79 mg/dL).    No Known Allergies  Antimicrobials this admission: 9/15 ceftriaxone >>   Dose adjustments this admission:   Microbiology results:   Elicia Lamp, PharmD, BCPS Clinical Pharmacist 10/10/2015 9:05 AM

## 2015-10-10 NOTE — Progress Notes (Signed)
Pharmacy Antibiotic Note  Matthew Small is a 50 y.o. male admitted on 10/10/2015 with sepsis/UTI.  Pharmacy has been consulted for ceftriaxone dosing. Code sepsis called at 0853. Tmax 100.9, LA 3.17.  Ceftriaxone 2g ordered x 1 dose in the ED. Pharmacy was consulted to transition patient from ceftriaxone to cefepime for sepsis secondary to UTI.  Plan: Cefepime 1g IV q8h Stop ceftriaxone Monitor culture data, renal function and clinical course  Height: 5\' 5"  (165.1 cm) Weight: 179 lb (81.2 kg) IBW/kg (Calculated) : 61.5  Temp (24hrs), Avg:100.2 F (37.9 C), Min:99 F (37.2 C), Max:100.9 F (38.3 C)   Recent Labs Lab 10/10/15 0843 10/10/15 0856 10/10/15 1326  WBC 24.0*  --   --   CREATININE 0.89  --   --   LATICACIDVEN  --  3.17* 1.13    Estimated Creatinine Clearance: 97.5 mL/min (by C-G formula based on SCr of 0.89 mg/dL).    No Known Allergies  Antimicrobials this admission: Ceftriaxone 9/15 x1 Cefepime 9/15>>  Dose adjustments this admission: n/a  Microbiology results: 9/15 BCx: sent 9/15 UCx: sent   Andrey Cota. Diona Foley, PharmD, [place] Clinical Pharmacist Pager [phone] 10/10/2015 3:02 PM

## 2015-10-10 NOTE — ED Provider Notes (Signed)
[facility] DEPT Provider Note   CSN: [id number] Arrival date & time: 10/10/15  [id number]     History   Chief Complaint Chief Complaint  Patient presents with  . Chest Pain    HPI Matthew Small is a 50 y.o. male with a PMHx of depression, HLD, HTN, nephrolithiasis, and BPH (self-caths), who presents to the ED with complaints of chest/epigastric pain that began at 3 AM. Patient states that he awoke at 3 AM to self catheterize and was having some central chest pain that then became more epigastric pain, followed by chills. He describes his pain as 10/10 constant throbbing epigastric pain radiating into his lower back, worse with "everything" and with no treatments tried prior to arrival. His wife took his temperature and he was noted to have a fever of 102.0. He also complains of nausea and lightheadedness. He states that he has been running low on supplies for his catheterizations, so he has been using old catheters. He noticed that he has had some hematuria and some passage of small clots when he caths. He says it hurts to catheterize himself, but he denies dysuria or malodorous urine.  He denies any shortness of breath, cough, leg swelling, recent travel/surgery/immobilization, personal or family history of DVT/PE, diaphoresis, vomiting, diarrhea, constipation, obstipation, melena, hematochezia, dysuria, malodorous urine, penile discharge, testicular pain or swelling, numbness, tingling, or focal weakness. He denies any sick contacts, and is a nonsmoker. Positive family history of MI in his mother at age 81 which happened last week. PCP Herold Harms at the [facility] in [place].    The history is provided by the patient and medical records. No language interpreter was used.  Chest Pain   Associated symptoms include abdominal pain, back pain (radiating from abdomen), a fever (102.0) and nausea. Pertinent negatives include no cough, no diaphoresis, no numbness, no shortness of breath, no vomiting  and no weakness.  Abdominal Pain   This is a new problem. The current episode started 6 to 12 hours ago. The problem occurs constantly. The problem has not changed since onset.Associated with: self-cath with old supplies. The pain is located in the epigastric region and chest. The quality of the pain is throbbing. The pain is at a severity of 10/10. The pain is severe. Associated symptoms include fever (102.0), nausea and hematuria. Pertinent negatives include diarrhea, flatus, hematochezia, melena, vomiting, constipation, dysuria, arthralgias and myalgias. Exacerbated by: "everything" Nothing relieves the symptoms.    Past Medical History:  Diagnosis Date  . Depression   . Hypercholesteremia   . Hypertension   . Prostate hyperplasia with urinary obstruction     There are no active problems to display for this patient.   History reviewed. No pertinent surgical history.     Home Medications    Prior to Admission medications   Medication Sig Start Date End Date Taking? Authorizing Provider  atorvastatin (LIPITOR) 40 MG tablet Take 20 mg by mouth daily.    Historical Provider, MD  DULoxetine (CYMBALTA) 30 MG capsule Take 90 mg by mouth daily.    Historical Provider, MD  losartan (COZAAR) 100 MG tablet Take 100 mg by mouth daily. 11/28/13   Historical Provider, MD  meclizine (ANTIVERT) 50 MG tablet Take 1 tablet (50 mg total) by mouth 3 (three) times daily as needed for dizziness. Patient not taking: Reported on 10/03/2015 01/08/14   Pamella Pert, MD  Multiple Vitamin (MULTIVITAMIN WITH MINERALS) TABS tablet Take 1 tablet by mouth daily. [place] [facility]    Historical Provider,  MD  ondansetron (ZOFRAN ODT) 4 MG disintegrating tablet 4mg  ODT q4 hours prn nausea/vomit Patient not taking: Reported on 10/03/2015 01/08/14   Pamella Pert, MD  ondansetron (ZOFRAN) 4 MG tablet Take 1 tablet (4 mg total) by mouth daily as needed for nausea or vomiting. Patient not taking: Reported on 10/03/2015  10/22/14   Nance Pear, MD  oxyCODONE-acetaminophen (ROXICET) 5-325 MG tablet Take 1 tablet by mouth every 4 (four) hours as needed for severe pain. Patient not taking: Reported on 10/03/2015 10/22/14   Nance Pear, MD  polyethylene glycol [facility]) packet Take 17 g by mouth daily. 10/03/15   Ezequiel Essex, MD  tamsulosin (FLOMAX) 0.4 MG CAPS capsule Take 1 capsule (0.4 mg total) by mouth daily. Patient taking differently: Take 0.8 mg by mouth daily.  10/22/14   Nance Pear, MD    Family History History reviewed. No pertinent family history.  Social History Social History  Substance Use Topics  . Smoking status: Former Smoker    Types: Cigarettes    Quit date: 01/07/1984  . Smokeless tobacco: Never Used  . Alcohol use 6.6 oz/week    8 Cans of beer, 3 Shots of liquor per week     Allergies   Review of patient's allergies indicates no known allergies.   Review of Systems Review of Systems  Constitutional: Positive for chills and fever (102.0). Negative for diaphoresis.  Respiratory: Negative for cough and shortness of breath.   Cardiovascular: Positive for chest pain (epigastric). Negative for leg swelling.  Gastrointestinal: Positive for abdominal pain and nausea. Negative for blood in stool, constipation, diarrhea, flatus, hematochezia, melena and vomiting.  Genitourinary: Positive for hematuria. Negative for discharge, dysuria, scrotal swelling and testicular pain.  Musculoskeletal: Positive for back pain (radiating from abdomen). Negative for arthralgias and myalgias.  Skin: Negative for color change.  Allergic/Immunologic: Negative for immunocompromised state.  Neurological: Positive for light-headedness. Negative for weakness and numbness.  Psychiatric/Behavioral: Negative for confusion.   10 Systems reviewed and are negative for acute change except as noted in the HPI.   Physical Exam Updated Vital Signs BP 121/78   Pulse 120   Temp 100.6 F (38.1 C) (Oral)    Resp 22   Ht 5\' 5"  (1.651 m)   SpO2 96%   Physical Exam  Constitutional: He is oriented to person, place, and time. He appears well-developed and well-nourished.  Non-toxic appearance. He appears distressed (uncomfortable appearing).  Febrile 100.9, nontoxic, appears uncomfortable, writhing around in pain  HENT:  Head: Normocephalic and atraumatic.  Mouth/Throat: Oropharynx is clear and moist and mucous membranes are normal.  Eyes: Conjunctivae and EOM are normal. Right eye exhibits no discharge. Left eye exhibits no discharge.  Neck: Normal range of motion. Neck supple.  Cardiovascular: Regular rhythm, normal heart sounds and intact distal pulses.  Tachycardia present.  Exam reveals no gallop and no friction rub.   No murmur heard. Tachycardic, reg rhythm, nl s1/s2, no m/r/g, distal pulses intact, no pedal edema   Pulmonary/Chest: Effort normal and breath sounds normal. No respiratory distress. He has no decreased breath sounds. He has no wheezes. He has no rhonchi. He has no rales.  CTAB in all lung fields, no w/r/r, no hypoxia or increased WOB, speaking in full sentences, SpO2 100% on RA   Abdominal: Soft. Normal appearance and bowel sounds are normal. He exhibits no distension. There is generalized tenderness. There is CVA tenderness. There is no rigidity, no rebound, no guarding, no tenderness at McBurney's point and negative Murphy's  sign. Hernia confirmed negative in the right inguinal area and confirmed negative in the left inguinal area.  Soft, nondistended, +BS throughout, with somewhat diffuse generalized abd TTP but most focally in the epigastrum and suprapubic region, no r/g/r, neg murphy's, neg mcburney's, with mild L sided CVA TTP   Genitourinary: Testes normal and penis normal. Cremasteric reflex is present. Right testis shows no mass, no swelling and no tenderness. Left testis shows no mass, no swelling and no tenderness. Uncircumcised. No phimosis, paraphimosis, hypospadias,  penile erythema or penile tenderness. No discharge found.  Genitourinary Comments: Chaperone present for exam Unircumcised penis without phimosis/paraphimosis, hypospadias, erythema, tenderness, or discharge. Some dried blood noted to the area around the urethral meatus, no clots noted, no lacerations/abrasions of urethral meatus. No rashes or lesions. Testes with no masses or tenderness, no swelling, and cremasterics reflex present bilaterally. No abnormal lie. No inguinal hernias or adenopathy present.   Musculoskeletal: Normal range of motion.  MAE x4 Strength and sensation grossly intact Distal pulses intact Gait steady No pedal edema  Neurological: He is alert and oriented to person, place, and time. He has normal strength. No sensory deficit.  Skin: Skin is warm, dry and intact. No rash noted.  Psychiatric: He has a normal mood and affect.  Nursing note and vitals reviewed.    ED Treatments / Results  Labs (all labs ordered are listed, but only abnormal results are displayed) Labs Reviewed  COMPREHENSIVE METABOLIC PANEL - Abnormal; Notable for the following:       Result Value   Sodium 134 (*)    CO2 20 (*)    Glucose, Bld 112 (*)    All other components within normal limits  CBC WITH DIFFERENTIAL/PLATELET - Abnormal; Notable for the following:    WBC 24.0 (*)    Neutro Abs 21.3 (*)    Monocytes Absolute 1.6 (*)    All other components within normal limits  URINALYSIS, ROUTINE W REFLEX MICROSCOPIC (NOT AT [facility]) - Abnormal; Notable for the following:    APPearance CLOUDY (*)    Hgb urine dipstick LARGE (*)    Leukocytes, UA SMALL (*)    All other components within normal limits  URINE MICROSCOPIC-ADD ON - Abnormal; Notable for the following:    Squamous Epithelial / LPF 0-5 (*)    Bacteria, UA FEW (*)    All other components within normal limits  I-STAT CG4 LACTIC ACID, ED - Abnormal; Notable for the following:    Lactic Acid, Venous 3.17 (*)    All other components  within normal limits  CULTURE, BLOOD (ROUTINE X 2)  CULTURE, BLOOD (ROUTINE X 2)  URINE CULTURE  RPR  HIV ANTIBODY (ROUTINE TESTING)  LIPASE, BLOOD  I-STAT TROPOININ, ED  I-STAT CG4 LACTIC ACID, ED  GC/CHLAMYDIA PROBE AMP ([place]) NOT AT [facility]    EKG  EKG Interpretation  Date/Time:  Friday October 10 2015 08:19:23 EDT Ventricular Rate:  117 PR Interval:  130 QRS Duration: 94 QT Interval:  344 QTC Calculation: 479 R Axis:   92 Text Interpretation:  Sinus tachycardia Rightward axis Inferior infarct , age undetermined Abnormal ECG No STEMI.  Confirmed by LONG MD, JOSHUA [phone]) on 10/10/2015 9:04:44 AM       Radiology Dg Chest 2 View  Result Date: 10/10/2015 CLINICAL DATA:  Fever, body aches and chest pain suggest today. EXAM: CHEST  2 VIEW COMPARISON:  10/03/2015 FINDINGS: The heart size and mediastinal contours are within normal limits. Both lungs are clear. The  visualized skeletal structures are unremarkable. IMPRESSION: Normal chest x-ray. Electronically Signed   By: Marijo Sanes M.D.   On: 10/10/2015 09:38   Ct Abdomen Pelvis W Contrast  Result Date: 10/10/2015 CLINICAL DATA:  Epigastric pain, fever EXAM: CT ABDOMEN AND PELVIS WITH CONTRAST TECHNIQUE: Multidetector CT imaging of the abdomen and pelvis was performed using the standard protocol following bolus administration of intravenous contrast. CONTRAST:  175mL ISOVUE-300 IOPAMIDOL (ISOVUE-300) INJECTION 61% COMPARISON:  None. FINDINGS: Lower chest: No acute abnormality. Hepatobiliary: No focal liver abnormality is seen. No gallstones, gallbladder wall thickening, or biliary dilatation. Pancreas: Unremarkable. No pancreatic ductal dilatation or surrounding inflammatory changes. Spleen: Normal in size without focal abnormality. Adrenals/Urinary Tract: Adrenal glands are unremarkable. Kidneys are normal, without renal calculi, focal lesion, or hydronephrosis. Foley catheter within the bladder. Severe bladder wall thickening  which may be secondary to underdistention versus cystitis versus bladder outlet obstruction. Stomach/Bowel: Stomach is within normal limits. No evidence of bowel wall thickening, distention, or inflammatory changes. Appendix is not visualized. Vascular/Lymphatic: No significant vascular findings are present. No enlarged abdominal or pelvic lymph nodes. Reproductive: Prostate is mildly enlarged. Other: No abdominal wall hernia or abnormality. No abdominopelvic ascites. Musculoskeletal: No acute or significant osseous findings. Mild osteoarthritis of bilateral sacroiliac joints. Broad-based disc bulge at L5-S1. IMPRESSION: 1. Severe bladder wall thickening which may be secondary to underdistention versus cystitis versus bladder outlet obstruction. Electronically Signed   By: Kathreen Devoid   On: 10/10/2015 12:58    Procedures Procedures (including critical care time)  CRITICAL CARE- sepsis Performed by: Corine Shelter   Total critical care time: 60 minutes  Critical care time was exclusive of separately billable procedures and treating other patients.  Critical care was necessary to treat or prevent imminent or life-threatening deterioration.  Critical care was time spent personally by me on the following activities: development of treatment plan with patient and/or surrogate as well as nursing, discussions with consultants, evaluation of patient's response to treatment, examination of patient, obtaining history from patient or surrogate, ordering and performing treatments and interventions, ordering and review of laboratory studies, ordering and review of radiographic studies, pulse oximetry and re-evaluation of patient's condition.   Medications Ordered in ED Medications  0.9 %  sodium chloride infusion (1,000 mLs Intravenous New Bag/Given 10/10/15 0914)  cefTRIAXone (ROCEPHIN) 2 g in dextrose 5 % 50 mL IVPB (not administered)  sodium chloride 0.9 % bolus 1,000 mL (0 mLs  Intravenous Stopped 10/10/15 0937)    And  sodium chloride 0.9 % bolus 1,000 mL (0 mLs Intravenous Stopped 10/10/15 1006)    And  sodium chloride 0.9 % bolus 500 mL (0 mLs Intravenous Stopped 10/10/15 1004)  cefTRIAXone (ROCEPHIN) 2 g in dextrose 5 % 50 mL IVPB (0 g Intravenous Stopped 10/10/15 1001)  acetaminophen (TYLENOL) tablet 650 mg (650 mg Oral Given 10/10/15 0907)  ondansetron (ZOFRAN) injection 4 mg (4 mg Intravenous Given 10/10/15 0858)  morphine 4 MG/ML injection 4 mg (4 mg Intravenous Given 10/10/15 0906)  famotidine (PEPCID) IVPB 20 mg premix (0 mg Intravenous Stopped 10/10/15 1001)  aspirin chewable tablet 324 mg (324 mg Oral Given 10/10/15 0912)  fentaNYL (SUBLIMAZE) injection 50 mcg (50 mcg Intravenous Given 10/10/15 1002)  iopamidol (ISOVUE-300) 61 % injection (100 mLs  Contrast Given 10/10/15 1218)     Initial Impression / Assessment and Plan / ED Course  I have reviewed the triage vital signs and the nursing notes.  Pertinent labs & imaging results that were available during  my care of the patient were reviewed by me and considered in my medical decision making (see chart for details).  Clinical Course    50 y.o. male here with CP/epigastric pain that began at 3am, fever/chills, nausea, some lightheadedness, and hematuria. Self-caths due to BPH, running low on supplies so he's been using old caths. On exam, some generalized abd TTP mostly in the epigastrum and suprapubic region, mild L flank TTP, nonperitoneal. Tachycardic and febrile, thus meets sepsis criteria, likely urinary source. ?Pyelo. Penile exam reveals some dried blood at urethral meatus but no abrasions/lacerations of the meatus, no clots seen at the meatal opening. No testicular tenderness/swelling. Clear lung exam, no pedal edema. Denies SOB to me, doubt PE. EKG  With isolated TWI in LL3 but no other acute ischemic findings. Overall, symptoms likely related to sepsis of urinary etiology. Will get CXR, trop, lactic, CBC  w/diff, CMP, lipase, U/A, UCx, BCx, HIV/RPR/GC/CT, bladder scan and insert foley catheter, and give ASA, pepcid, morphine, zofran, tylenol, weight based fluids, and empiric rocephin. Will also obtain CT abd/pelv to eval for pyelo vs other etiology. Will reassess shortly. Discussed case with my attending Dr. Laverta Baltimore who agrees with plan.   10:30 AM Pt stable. Lactic 3.17. CBC w/diff with leukocytosis 0000000 with neutrophilic predominance. CMP with mildly low Na 134, bicarb slightly low at 20 likely from slight lactic acidosis, no anion gap. Lipase WNL. Bladder scan 226mL, coude cath placed. Trop neg. CXR neg. Awaiting U/A and CT Abd/pelv. Requested more pain meds earlier before the cath placement, fentanyl given and helped. Will reassess shortly  12:04 PM Pt feeling much better, nausea and pain resolved, appears much better than when I originally was there. Fluids still running, HR now in the 80-90s. U/A results show cloudy urine with large hgb, TNTC RBCs, small leuks, no nitrites, 0-5 squamous, 0-5 WBCs, and few bacteria, and amorphous urates seen on microscopy-- all of which is consistent with UTI as a source, given L flank tenderness I suspect it's actually pyelonephritis. CT Abd/pelv not yet done, will await results of this then admit for IV abx. Pt declines needing anything at this time, very pleasant and thankful of care so far. Will reassess after CT A/P results  1:40 PM CT Abd/pelv showing no evidence of pyelo, severe bladder wall thickening which could be due to underdistension vs cystitis vs outlet obstruction, clinically likely due to cystitis. Of note, HIV/RPR resulted and was nonreactive. Temp now 99, which is down from earlier. Will now proceed with admission   2:07 PM Dr. Tana Coast of [facility] returning page and will admit. Holding orders placed. Of note, repeat lactic trended down to 1.13 which is also reassuring. Please see admitting team's notes for further documentation of care. Pt stable at this time.  I appreciate the admitting teams' help with this patient's care!   Final Clinical Impressions(s) / ED Diagnoses   Final diagnoses:  Sepsis, due to unspecified organism [facility])  Hematuria  Chest pain, unspecified chest pain type  Generalized abdominal pain  Flank pain  Fever, unspecified fever cause  Nausea  Neutrophilic leukocytosis  UTI (lower urinary tract infection)  BPH (benign prostatic hyperplasia)    New Prescriptions New Prescriptions   No medications on file     [place], PA-C 10/10/15 [place], MD 10/10/15 2015

## 2015-10-10 NOTE — ED Notes (Signed)
Pt weighed.  179 lbs.

## 2015-10-10 NOTE — Progress Notes (Signed)
Pt admitted to unit, screaming in pain in his head that is causing him to feel dizzy. Vital signs stable, neuro checks done, WNL. I paged Dr. Tana Coast, orders received for 1 mg of dilaudid. I gave this to him with very good success of pain control

## 2015-10-10 NOTE — ED Triage Notes (Signed)
Pt here from home with chest pain that started in middle of the night that woke him up , pt also c/o sob and nausea

## 2015-10-10 NOTE — ED Notes (Signed)
Bladder scan of pt shows the greatest concentration at 223 mL.

## 2015-10-10 NOTE — ED Notes (Signed)
Pt returned from CT °

## 2015-10-10 NOTE — ED Notes (Signed)
Admit Doctor at bedside. Changing bed assignment from telemetry to step down.

## 2015-10-11 LAB — CBC
HCT: 39.4 % (ref 39.0–52.0)
Hemoglobin: 12.9 g/dL — ABNORMAL LOW (ref 13.0–17.0)
MCH: 28.7 pg (ref 26.0–34.0)
MCHC: 32.7 g/dL (ref 30.0–36.0)
MCV: 87.8 fL (ref 78.0–100.0)
PLATELETS: 215 10*3/uL (ref 150–400)
RBC: 4.49 MIL/uL (ref 4.22–5.81)
RDW: 13.2 % (ref 11.5–15.5)
WBC: 18.5 10*3/uL — AB (ref 4.0–10.5)

## 2015-10-11 LAB — BASIC METABOLIC PANEL
ANION GAP: 6 (ref 5–15)
BUN: 8 mg/dL (ref 6–20)
CALCIUM: 8.5 mg/dL — AB (ref 8.9–10.3)
CO2: 26 mmol/L (ref 22–32)
CREATININE: 0.69 mg/dL (ref 0.61–1.24)
Chloride: 105 mmol/L (ref 101–111)
Glucose, Bld: 103 mg/dL — ABNORMAL HIGH (ref 65–99)
Potassium: 3.6 mmol/L (ref 3.5–5.1)
SODIUM: 137 mmol/L (ref 135–145)

## 2015-10-11 LAB — URINE CULTURE: CULTURE: NO GROWTH

## 2015-10-11 MED ORDER — SODIUM CHLORIDE 0.9 % IV SOLN
1000.0000 mL | INTRAVENOUS | Status: DC
  Administered 2015-10-11 – 2015-10-12 (×2): 1000 mL via INTRAVENOUS

## 2015-10-11 NOTE — Progress Notes (Signed)
Offered Pt assistance with bath, He stated he will take care of bath and most likely wait until his wife arrives to complete bath. Tech gave Pt all bath supplies needed.

## 2015-10-11 NOTE — Progress Notes (Signed)
[facility]                                                                              Patient Demographics  Matthew Small, is a 50 y.o. male, DOB - 08-03-1965, BWG:[id number]  Admit date - 10/10/2015   Admitting Physician Ripudeep Krystal Eaton, MD  Outpatient Primary MD for the patient is No PCP Per Patient  Outpatient specialists:   LOS - 1  days    Chief Complaint  Patient presents with  . Chest Pain       Brief summary   Patient is a 50 year old male with depression, hyperlipidemia, hypertension, nephrolithiasis, BPH and does intermittent self-catheterization presented to ED with fevers, chills pain everywhere since 3 AMOf the morning of admission. Per patient he was diagnosed with significant BPH 3 years ago and since then he has been self cathing himself. He was running out of the catheters hence was rationing them out in the last 6 weeks, was using same catheters again and again and old catheters. This morning, patient woke up with fevers and chills, temp of 102 at home, abdominal pain, back pain and chest pain. He described the chest pain as all over, constant with nausea and associated with back pain and abdominal pain. He complained of nausea but no vomiting. Per patient, he also noticed hematuria and passage of small clots today otherwise denied any dysuria or malodorous urine. Per patient he does not have appointment till November to see his PCP and hence could not get more refills on his catheter supplies.   Assessment & Plan   Sepsis ([place]) due to UTI, self-catheterization with old catheters - Patient met sepsis criteria with fever, tachycardia, lactic acidosis, urinary source, probable pyelonephritis, improving  - Leukocytosis improving, afebrile, Follow blood cultures, urine cultures, aggressive IV fluid hydration - Spoke with urology, Dr. Louis Meckel, recommended continue Foley catheter for 2 weeks, voiding trial at their office in 2 weeks, continue antibiotics  for 2 week course. - CT abdomen and pelvis reviewed, no hydronephrosis or renal stone or abscess, consistent with cystitis or bladder outlet obstruction. - Decrease IV fluids   Active Problems:    BPH (benign prostatic hypertrophy) - Continue Flomax    Hematuria - Resolved - Continue IV fluids and Foley catheter    Lactic acidosis -Likely due to #1, continue IV fluid hydration, lactic acid improving     Leukocytosis - Improving - Likely due to #1, follow blood cultures, urine cultures, continue IV cefepime    Hypotension with history of hypertension - BP significant improved, decrease IV fluids, continue to hold losartan     Anxiety - Continue Cymbalta  Atypical chest pain, abdominal pain, back pain - Likely due to pyelonephritis, #1. The troponin negative, EKG with no acute ischemic changes. Currently no chest pain.  Code Status: Full CODE STATUS DVT Prophylaxis:  Lovenox  Family Communication: Discussed in detail with the patient, all imaging results, lab results explained to the patient   Disposition Plan: Likely DC in 24-48 hours, pending cultures Time Spent in minutes 25 minutes  Procedures:  CT abdomen  Consultants:   Urology over the phone  Antimicrobials:    IV cefepime 9/15   Medications  Scheduled Meds: . atorvastatin  20 mg Oral Daily  . ceFEPime (MAXIPIME) IV  1 g Intravenous Q8H  . DULoxetine  90 mg Oral Daily  . enoxaparin (LOVENOX) injection  40 mg Subcutaneous Q24H  . multivitamin with minerals  1 tablet Oral Daily  . polyethylene glycol  17 g Oral Daily  . sodium chloride flush  3 mL Intravenous Q12H  . tamsulosin  0.4 mg Oral Daily   Continuous Infusions: . sodium chloride 1,000 mL (10/10/15 0914)  . sodium chloride 125 mL/hr at 10/10/15 1745   PRN Meds:.acetaminophen **OR** acetaminophen, HYDROcodone-acetaminophen, HYDROmorphone (DILAUDID) injection, ondansetron **OR** ondansetron (ZOFRAN) IV   Antibiotics   Anti-infectives     Start     Dose/Rate Route Frequency Ordered Stop   10/11/15 0900  cefTRIAXone (ROCEPHIN) 2 g in dextrose 5 % 50 mL IVPB  Status:  Discontinued     2 g 100 mL/hr over 30 Minutes Intravenous Every 24 hours 10/10/15 0903 10/10/15 1506   10/10/15 1530  ceFEPIme (MAXIPIME) 1 g in dextrose 5 % 50 mL IVPB     1 g 100 mL/hr over 30 Minutes Intravenous Every 8 hours 10/10/15 1506     10/10/15 0900  cefTRIAXone (ROCEPHIN) 2 g in dextrose 5 % 50 mL IVPB     2 g 100 mL/hr over 30 Minutes Intravenous  Once 10/10/15 0845 10/10/15 1001        Subjective:   Matthew Small was seen and examined today.  Feeling a lot better today, afebrile, abdominal pain and back pain significantly better. No chest pain. Patient denies dizziness,  shortness of breath,  N/V/D/C, new weakness, numbess, tingling. No acute events overnight.    Objective:   Vitals:   10/10/15 2021 10/11/15 0016 10/11/15 0410 10/11/15 0740  BP: 123/72 124/81 120/80 129/79  Pulse: 93 82 86 75  Resp: _0 Temp: 98.3 F (36.8 C) 98.4 F (36.9 C) 98.4 F (36.9 C) 98.3 F (36.8 C)  TempSrc: Oral Oral Oral Oral  SpO2: 97% 98% 97% 98%  Weight:   82.7 kg (182 lb 4.8 oz)   Height:        Intake/Output Summary (Last 24 hours) at 10/11/15 1029 Last data filed at 10/11/15 0944  Gross per 24 hour  Intake          4060.83 ml  Output             2550 ml  Net          1510.83 ml     Wt Readings from Last 3 Encounters:  10/11/15 82.7 kg (182 lb 4.8 oz)  10/03/15 77.1 kg (170 lb)  10/22/14 81.6 kg (180 lb)     Exam  General: Alert and oriented x 3, NAD  HEENT:  PERRLA, EOMI, Anicteric Sclera, mucous membranes moist.   Neck: Supple, no JVD, no masses  Cardiovascular: S1 S2 auscultated, no rubs, murmurs or gallops. Regular rate and rhythm.  Respiratory: Clear to auscultation bilaterally, no wheezing, rales or rhonchi  Gastrointestinal: Soft, nontender, nondistended, + bowel sounds  Ext: no cyanosis clubbing or  edema  Neuro: no new deficit  Skin: No rashes  Psych: Normal affect and demeanor, alert and oriented x3    Data Reviewed:  I have personally reviewed following labs and imaging studies  Micro Results Recent Results (from the past 240 hour(s))  Blood Culture (routine x 2)  Status: None (Preliminary result)   Collection Time: 10/10/15  8:43 AM  Result Value Ref Range Status   Specimen Description BLOOD RIGHT FOREARM  Final   Special Requests BOTTLES DRAWN AEROBIC AND ANAEROBIC 5CC  Final   Culture NO GROWTH < 12 HOURS  Final   Report Status PENDING  Incomplete  Blood Culture (routine x 2)     Status: None (Preliminary result)   Collection Time: 10/10/15  9:15 AM  Result Value Ref Range Status   Specimen Description BLOOD RIGHT FOREARM  Final   Special Requests BOTTLES DRAWN AEROBIC AND ANAEROBIC 5CC  Final   Culture NO GROWTH < 12 HOURS  Final   Report Status PENDING  Incomplete  MRSA PCR Screening     Status: None   Collection Time: 10/10/15  5:35 PM  Result Value Ref Range Status   MRSA by PCR NEGATIVE NEGATIVE Final    Comment:        The GeneXpert MRSA Assay (FDA approved for NASAL specimens only), is one component of a comprehensive MRSA colonization surveillance program. It is not intended to diagnose MRSA infection nor to guide or monitor treatment for MRSA infections.     Radiology Reports Dg Chest 2 View  Result Date: 10/10/2015 CLINICAL DATA:  Fever, body aches and chest pain suggest today. EXAM: CHEST  2 VIEW COMPARISON:  10/03/2015 FINDINGS: The heart size and mediastinal contours are within normal limits. Both lungs are clear. The visualized skeletal structures are unremarkable. IMPRESSION: Normal chest x-ray. Electronically Signed   By: Marijo Sanes M.D.   On: 10/10/2015 09:38   Ct Abdomen Pelvis W Contrast  Result Date: 10/10/2015 CLINICAL DATA:  Epigastric pain, fever EXAM: CT ABDOMEN AND PELVIS WITH CONTRAST TECHNIQUE: Multidetector CT imaging of  the abdomen and pelvis was performed using the standard protocol following bolus administration of intravenous contrast. CONTRAST:  167m ISOVUE-300 IOPAMIDOL (ISOVUE-300) INJECTION 61% COMPARISON:  None. FINDINGS: Lower chest: No acute abnormality. Hepatobiliary: No focal liver abnormality is seen. No gallstones, gallbladder wall thickening, or biliary dilatation. Pancreas: Unremarkable. No pancreatic ductal dilatation or surrounding inflammatory changes. Spleen: Normal in size without focal abnormality. Adrenals/Urinary Tract: Adrenal glands are unremarkable. Kidneys are normal, without renal calculi, focal lesion, or hydronephrosis. Foley catheter within the bladder. Severe bladder wall thickening which may be secondary to underdistention versus cystitis versus bladder outlet obstruction. Stomach/Bowel: Stomach is within normal limits. No evidence of bowel wall thickening, distention, or inflammatory changes. Appendix is not visualized. Vascular/Lymphatic: No significant vascular findings are present. No enlarged abdominal or pelvic lymph nodes. Reproductive: Prostate is mildly enlarged. Other: No abdominal wall hernia or abnormality. No abdominopelvic ascites. Musculoskeletal: No acute or significant osseous findings. Mild osteoarthritis of bilateral sacroiliac joints. Broad-based disc bulge at L5-S1. IMPRESSION: 1. Severe bladder wall thickening which may be secondary to underdistention versus cystitis versus bladder outlet obstruction. Electronically Signed   By: HKathreen Devoid  On: 10/10/2015 12:58   Dg Chest Port 1 View  Result Date: 10/10/2015 CLINICAL DATA:  50year old presenting with urinary tract infection and sepsis. Current history of hypertension. EXAM: PORTABLE CHEST 1 VIEW 5:13 p.m.: COMPARISON:  Two view chest x-ray earlier today 9:20 a.m. and one view chest x-ray 10/03/2015. FINDINGS: Cardiac silhouette normal in size, unchanged. Thoracic aorta mildly tortuous, unchanged. Hilar and mediastinal  contours otherwise unremarkable. Suboptimal inspiration. Lungs clear. Bronchovascular markings normal. Pulmonary vascularity normal. No visible pleural effusions. No pneumothorax. IMPRESSION: Suboptimal inspiration.  No acute cardiopulmonary disease. Electronically Signed  By: Evangeline Dakin M.D.   On: 10/10/2015 17:23   Dg Abdomen Acute W/chest  Result Date: 10/03/2015 CLINICAL DATA:  Abdominal pain for 6 days EXAM: DG ABDOMEN ACUTE W/ 1V CHEST COMPARISON:  CT abdomen and pelvis December 23, 2012 FINDINGS: PA chest: Lungs are clear. Heart size and pulmonary vascularity are normal. No adenopathy. No bone lesions. Supine and upright abdomen: There is moderate stool throughout the colon. There is no bowel dilatation or air-fluid level to suggest bowel obstruction. No free air. Calcifications in the pelvis most likely represent phleboliths. IMPRESSION: Moderate stool throughout colon. No bowel obstruction or free air. No lung edema or consolidation. Electronically Signed   By: Lowella Grip III M.D.   On: 10/03/2015 11:24    Lab Data:  CBC:  Recent Labs Lab 10/10/15 0843 10/10/15 1847 10/11/15 0437  WBC 24.0* 23.3* 18.5*  NEUTROABS 21.3* 18.7*  --   HGB 15.8 13.1 12.9*  HCT 45.5 39.1 39.4  MCV 85.7 85.7 87.8  PLT 256 205 762   Basic Metabolic Panel:  Recent Labs Lab 10/10/15 0843 10/10/15 1847 10/11/15 0437  NA 134* 135 137  K 3.8 3.5 3.6  CL 103 107 105  CO2 20* 23 26  GLUCOSE 112* 105* 103*  BUN _0 CREATININE 0.89 0.77 0.69  CALCIUM 9.4 8.2* 8.5*   GFR: Estimated Creatinine Clearance: 109.4 mL/min (by C-G formula based on SCr of 0.69 mg/dL). Liver Function Tests:  Recent Labs Lab 10/10/15 0843 10/10/15 1847  AST 28 19  ALT 31 23  ALKPHOS 104 76  BILITOT 1.0 1.3*  PROT 6.9 5.7*  ALBUMIN 4.2 3.4*    Recent Labs Lab 10/10/15 0843  LIPASE 30   No results for input(s): AMMONIA in the last 168 hours. Coagulation Profile:  Recent Labs Lab  10/10/15 1847  INR 1.16   Cardiac Enzymes: No results for input(s): CKTOTAL, CKMB, CKMBINDEX, TROPONINI in the last 168 hours. BNP (last 3 results) No results for input(s): PROBNP in the last 8760 hours. HbA1C: No results for input(s): HGBA1C in the last 72 hours. CBG: No results for input(s): GLUCAP in the last 168 hours. Lipid Profile: No results for input(s): CHOL, HDL, LDLCALC, TRIG, CHOLHDL, LDLDIRECT in the last 72 hours. Thyroid Function Tests: No results for input(s): TSH, T4TOTAL, FREET4, T3FREE, THYROIDAB in the last 72 hours. Anemia Panel: No results for input(s): VITAMINB12, FOLATE, FERRITIN, TIBC, IRON, RETICCTPCT in the last 72 hours. Urine analysis:    Component Value Date/Time   COLORURINE YELLOW 10/10/2015 1030   APPEARANCEUR CLOUDY (A) 10/10/2015 1030   APPEARANCEUR Hazy 12/22/2012 1940   LABSPEC 1.016 10/10/2015 1030   LABSPEC 1.020 12/22/2012 1940   PHURINE 5.5 10/10/2015 1030   GLUCOSEU NEGATIVE 10/10/2015 1030   GLUCOSEU Negative 12/22/2012 1940   HGBUR LARGE (A) 10/10/2015 1030   BILIRUBINUR NEGATIVE 10/10/2015 1030   BILIRUBINUR Negative 12/22/2012 1940   KETONESUR NEGATIVE 10/10/2015 1030   PROTEINUR NEGATIVE 10/10/2015 1030   NITRITE NEGATIVE 10/10/2015 1030   LEUKOCYTESUR SMALL (A) 10/10/2015 1030   LEUKOCYTESUR Negative 12/22/2012 1940     RAI,RIPUDEEP M.D. [facility] 10/11/2015, 10:29 AM  Pager: [phone] Between 7am to 7pm - call Pager - [phone]  After 7pm go to [email or web address] - password [email or web address]  Call night coverage person covering after 7pm

## 2015-10-12 LAB — CBC
HCT: 38.9 % — ABNORMAL LOW (ref 39.0–52.0)
Hemoglobin: 12.9 g/dL — ABNORMAL LOW (ref 13.0–17.0)
MCH: 28.2 pg (ref 26.0–34.0)
MCHC: 33.2 g/dL (ref 30.0–36.0)
MCV: 85.1 fL (ref 78.0–100.0)
PLATELETS: 207 10*3/uL (ref 150–400)
RBC: 4.57 MIL/uL (ref 4.22–5.81)
RDW: 12.7 % (ref 11.5–15.5)
WBC: 13.9 10*3/uL — ABNORMAL HIGH (ref 4.0–10.5)

## 2015-10-12 LAB — BASIC METABOLIC PANEL
Anion gap: 8 (ref 5–15)
BUN: 6 mg/dL (ref 6–20)
CALCIUM: 8.5 mg/dL — AB (ref 8.9–10.3)
CO2: 26 mmol/L (ref 22–32)
CREATININE: 0.64 mg/dL (ref 0.61–1.24)
Chloride: 102 mmol/L (ref 101–111)
GFR calc Af Amer: >60 mL/min (ref >60)
GLUCOSE: 101 mg/dL — AB (ref 65–99)
Potassium: 3.6 mmol/L (ref 3.5–5.1)
SODIUM: 136 mmol/L (ref 135–145)

## 2015-10-12 MED ORDER — TAMSULOSIN HCL 0.4 MG PO CAPS: 0.8000 mg | ORAL_CAPSULE | Freq: Every day | ORAL | 3 refills | Status: DC

## 2015-10-12 MED ORDER — FINASTERIDE 5 MG PO TABS: 5.0000 mg | ORAL_TABLET | Freq: Every day | ORAL | 3 refills | Status: AC

## 2015-10-12 MED ORDER — TRAMADOL HCL 50 MG PO TABS: 50.0000 mg | ORAL_TABLET | Freq: Three times a day (TID) | ORAL | 0 refills | Status: AC | PRN

## 2015-10-12 MED ORDER — CEFUROXIME AXETIL 250 MG PO TABS: 500.0000 mg | ORAL_TABLET | Freq: Two times a day (BID) | ORAL | 0 refills | Status: DC

## 2015-10-12 MED ORDER — SENNOSIDES-DOCUSATE SODIUM 8.6-50 MG PO TABS: 2.0000 | ORAL_TABLET | Freq: Every day | ORAL | 0 refills | Status: AC

## 2015-10-12 MED ORDER — DEXTROSE 5 % IV SOLN
1.0000 g | Freq: Three times a day (TID) | INTRAVENOUS | Status: DC
  Administered 2015-10-12: 1 g via INTRAVENOUS
  Filled 2015-10-12 (×2): qty 1

## 2015-10-12 NOTE — Discharge Summary (Addendum)
Physician Discharge Summary   Patient ID: Matthew Small MRN: [id number] DOB/AGE: 02-17-1965 50 y.o.  Admit date: 10/10/2015 Discharge date: 10/12/2015  Primary Care Physician:  No PCP Per Patient  Discharge Diagnoses:    . Sepsis ([place]) . UTI (lower urinary tract infection) . BPH (benign prostatic hypertrophy) . Hematuria . Lactic acidosis . Leukocytosis . Hypertension . Anxiety   Consults: Urology, Dr. Louis Meckel via phone consultation  Outpatient Follow-up:  1. Patient recommended to continue Foley for 2 weeks and voiding trial in Dr. Carlton Adam office  2. Please repeat CBC/BMET at next visit 3. Please follow blood/urine cultures till final   DIET: * regular diet **    Allergies:  No Known Allergies   DISCHARGE MEDICATIONS: Current Discharge Medication List    START taking these medications   Details  cefUROXime (CEFTIN) 250 MG tablet Take 2 tablets (500 mg total) by mouth 2 (two) times daily with a meal. X 12 more days Qty: 48 tablet, Refills: 0    finasteride (PROSCAR) 5 MG tablet Take 1 tablet (5 mg total) by mouth daily. For BPH Qty: 30 tablet, Refills: 3    senna-docusate (SENOKOT S) 8.6-50 MG tablet Take 2 tablets by mouth at bedtime. For constipation, available over the counter Qty: 60 tablet, Refills: 0    traMADol (ULTRAM) 50 MG tablet Take 1 tablet (50 mg total) by mouth every 8 (eight) hours as needed for moderate pain or severe pain. Qty: 20 tablet, Refills: 0      CONTINUE these medications which have CHANGED   Details  tamsulosin (FLOMAX) 0.4 MG CAPS capsule Take 2 capsules (0.8 mg total) by mouth daily. Qty: 60 capsule, Refills: 3      CONTINUE these medications which have NOT CHANGED   Details  atorvastatin (LIPITOR) 40 MG tablet Take 20 mg by mouth daily.    DULoxetine (CYMBALTA) 30 MG capsule Take 90 mg by mouth daily.    losartan (COZAAR) 100 MG tablet Take 100 mg by mouth daily. Refills: 0    Multiple Vitamin (MULTIVITAMIN WITH  MINERALS) TABS tablet Take 1 tablet by mouth daily. Megan Men    polyethylene glycol (MIRALAX) packet Take 17 g by mouth daily. Qty: 14 each, Refills: 0      STOP taking these medications     ondansetron (ZOFRAN ODT) 4 MG disintegrating tablet          Brief H and P: For complete details please refer to admission H and P, but in brief Patient is a 50 year old male with depression, hyperlipidemia, hypertension, nephrolithiasis, BPH and does intermittent self-catheterization presented to ED with fevers, chills pain everywhere since 3 AMOf the morning of admission. Per patient he was diagnosed with significant BPH 3 years ago and since then he has been self cathing himself. He was running out of the catheters hence was rationing themout in the last 6 weeks, was using same catheters again and again and old catheters. This morning, patient woke up with fevers and chills, temp of 102 at home, abdominal pain, back pain and chest pain. He described the chest pain as all over, constant with nausea and associated with back pain and abdominal pain. He complained of nausea but no vomiting. Per patient, he also noticed hematuria and passage of small clots today otherwise denied any dysuria or malodorous urine. Per patient he does not have appointment till November to see his PCP and hence could not get more refills on his catheter supplies.   Hospital Course:  Sepsis (  [facility])due to UTI, self-catheterization with old catheters - Patient met sepsis criteria on admission with fever, tachycardia, lactic acidosis, urinary source, probable pyelonephritis with CV angle tenderness, improving  - Leukocytosis improving, afebrile, blood cultures, urine cultures negative to date. Patient was placed on aggressive IV fluid hydration.   WBC count 24K at the time of admission, improved to 13 at the time of discharge. No fevers - Spoke with urology, Dr. Louis Meckel, recommended continue Foley catheter for 2 weeks, voidingtrial  at their office in 2 weeks, continue antibiotics for 2 week course. I have sent an email to Dr. Louis Meckel regarding the appointment. - CT abdomen and pelvis reviewed, no hydronephrosis or renal stone or abscess, consistent with cystitis or bladder outlet obstruction. - Patient was placed on IV cefepime while inpatient, transitioned to oral Ceftin for 12 more days to complete full course of 2 weeks.   BPH (benign prostatic hypertrophy) - Continue Flomax, added finasteride  Hematuria - Resolved - Continue Foley catheter for 2 weeks and voiding trial in the office  Lactic acidosis -Likely due to #1, continue IV fluid hydration, lactic acid improving  Leukocytosis - Blood cultures negative to date, urine cultures negative, WBC count improving  Hypotensionwith history of hypertension - BP improved, restart losartan  Anxiety - Continue Cymbalta  Atypical chest pain, abdominal pain, back pain - Likely due to pyelonephritis, #1. The troponin negative, EKG with no acute ischemic changes. Currently no chest pain.   Day of Discharge BP 125/77 (BP Location: Left Arm)   Pulse 77   Temp 98.6 F (37 C) (Oral)   Resp 17   Ht _0  (1.651 m)   Wt 82.2 kg (181 lb 4.8 oz)   SpO2 97%   BMI 30.17 kg/m   Physical Exam: General: Alert and awake oriented x3 not in any acute distress. HEENT: anicteric sclera, pupils reactive to light and accommodation CVS: S1-S2 clear no murmur rubs or gallops Chest: clear to auscultation bilaterally, no wheezing rales or rhonchi Abdomen: soft nontender, nondistended, normal bowel sounds Extremities: no cyanosis, clubbing or edema noted bilaterally Neuro: Cranial nerves II-XII intact, no focal neurological deficits   The results of significant diagnostics from this hospitalization (including imaging, microbiology, ancillary and laboratory) are listed below for reference.    LAB RESULTS: Basic Metabolic Panel:  Recent Labs Lab  10/11/15 0437 10/12/15 0448  NA 137 136  K 3.6 3.6  CL 105 102  CO2 26 26  GLUCOSE 103* 101*  BUN 8 6  CREATININE 0.69 0.64  CALCIUM 8.5* 8.5*   Liver Function Tests:  Recent Labs Lab 10/10/15 0843 10/10/15 1847  AST 28 19  ALT 31 23  ALKPHOS 104 76  BILITOT 1.0 1.3*  PROT 6.9 5.7*  ALBUMIN 4.2 3.4*    Recent Labs Lab 10/10/15 0843  LIPASE 30   No results for input(s): AMMONIA in the last 168 hours. CBC:  Recent Labs Lab 10/10/15 1847 10/11/15 0437 10/12/15 0448  WBC 23.3* 18.5* 13.9*  NEUTROABS 18.7*  --   --   HGB 13.1 12.9* 12.9*  HCT 39.1 39.4 38.9*  MCV 85.7 87.8 85.1  PLT 205 215 207   Cardiac Enzymes: No results for input(s): CKTOTAL, CKMB, CKMBINDEX, TROPONINI in the last 168 hours. BNP: Invalid input(s): POCBNP CBG: No results for input(s): GLUCAP in the last 168 hours.  Significant Diagnostic Studies:  Dg Chest 2 View  Result Date: 10/10/2015 CLINICAL DATA:  Fever, body aches and chest pain suggest today. EXAM: CHEST  2 VIEW COMPARISON:  10/03/2015 FINDINGS: The heart size and mediastinal contours are within normal limits. Both lungs are clear. The visualized skeletal structures are unremarkable. IMPRESSION: Normal chest x-ray. Electronically Signed   By: Marijo Sanes M.D.   On: 10/10/2015 09:38   Ct Abdomen Pelvis W Contrast  Result Date: 10/10/2015 CLINICAL DATA:  Epigastric pain, fever EXAM: CT ABDOMEN AND PELVIS WITH CONTRAST TECHNIQUE: Multidetector CT imaging of the abdomen and pelvis was performed using the standard protocol following bolus administration of intravenous contrast. CONTRAST:  146m ISOVUE-300 IOPAMIDOL (ISOVUE-300) INJECTION 61% COMPARISON:  None. FINDINGS: Lower chest: No acute abnormality. Hepatobiliary: No focal liver abnormality is seen. No gallstones, gallbladder wall thickening, or biliary dilatation. Pancreas: Unremarkable. No pancreatic ductal dilatation or surrounding inflammatory changes. Spleen: Normal in size  without focal abnormality. Adrenals/Urinary Tract: Adrenal glands are unremarkable. Kidneys are normal, without renal calculi, focal lesion, or hydronephrosis. Foley catheter within the bladder. Severe bladder wall thickening which may be secondary to underdistention versus cystitis versus bladder outlet obstruction. Stomach/Bowel: Stomach is within normal limits. No evidence of bowel wall thickening, distention, or inflammatory changes. Appendix is not visualized. Vascular/Lymphatic: No significant vascular findings are present. No enlarged abdominal or pelvic lymph nodes. Reproductive: Prostate is mildly enlarged. Other: No abdominal wall hernia or abnormality. No abdominopelvic ascites. Musculoskeletal: No acute or significant osseous findings. Mild osteoarthritis of bilateral sacroiliac joints. Broad-based disc bulge at L5-S1. IMPRESSION: 1. Severe bladder wall thickening which may be secondary to underdistention versus cystitis versus bladder outlet obstruction. Electronically Signed   By: HKathreen Devoid  On: 10/10/2015 12:58   Dg Chest Port 1 View  Result Date: 10/10/2015 CLINICAL DATA:  50year old presenting with urinary tract infection and sepsis. Current history of hypertension. EXAM: PORTABLE CHEST 1 VIEW 5:13 p.m.: COMPARISON:  Two view chest x-ray earlier today 9:20 a.m. and one view chest x-ray 10/03/2015. FINDINGS: Cardiac silhouette normal in size, unchanged. Thoracic aorta mildly tortuous, unchanged. Hilar and mediastinal contours otherwise unremarkable. Suboptimal inspiration. Lungs clear. Bronchovascular markings normal. Pulmonary vascularity normal. No visible pleural effusions. No pneumothorax. IMPRESSION: Suboptimal inspiration.  No acute cardiopulmonary disease. Electronically Signed   By: TEvangeline DakinM.D.   On: 10/10/2015 17:23    2D ECHO:   Disposition and Follow-up: Discharge Instructions    Diet general    Complete by:  As directed    Discharge instructions    Complete  by:  As directed    Please keep foley in for voiding trial at Dr Herrick's office in 2 weeks.   Increase activity slowly    Complete by:  As directed        DISPOSITION:  Home   DISCHARGE FOLLOW-UP Follow-up Information    HArdis Hughs MD. Schedule an appointment as soon as possible for a visit in 2 week(s).   Specialty:  Urology Why:  For voiding trial Contact information: [id number]           Time spent on Discharge: 25 MINS   Signed:   Ike Maragh M.D. [facility] 10/12/2015, 10:03 AM Pager: [phone] Coding query The discharge summary mentions that UTI was due to self catheterizations using old catheters.   Kota Ciancio M.D. [facility] 10/28/2015, 3:27 PM  Pager: [phone]

## 2015-10-13 LAB — GC/CHLAMYDIA PROBE AMP (~~LOC~~) NOT AT ARMC
CHLAMYDIA, DNA PROBE: NEGATIVE
Neisseria Gonorrhea: NEGATIVE

## 2015-10-15 LAB — CULTURE, BLOOD (ROUTINE X 2)
CULTURE: NO GROWTH
CULTURE: NO GROWTH

## 2015-11-13 IMAGING — CT CT CERVICAL SPINE W/O CM
3 of 6 series · 11 of 33 positions shown, 13 images · non-contrast
Comparison: None.

CLINICAL DATA: Fell 5 feet onto cement garage floor today. Head
pain. Blurry vision. Dizziness.

EXAM:
CT HEAD WITHOUT CONTRAST
CT CERVICAL SPINE WITHOUT CONTRAST
TECHNIQUE: Multidetector CT imaging of the head and cervical spine was
performed following the standard protocol without intravenous
contrast. Multiplanar CT image reconstructions of the cervical spine
were also generated.

[Series 304: axial · axial · 0.34mm/px · z∈[+91,+188]mm · 3 of 99 slices shown, 4 images]
[im 25/99  soft-tissue]
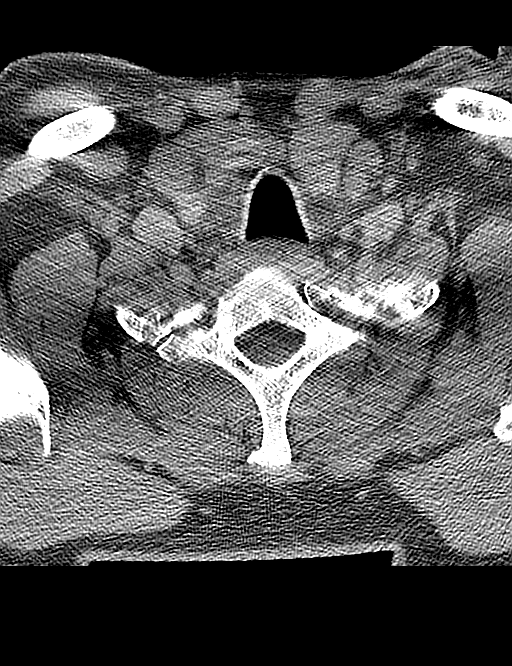
[im 25/99  bone]
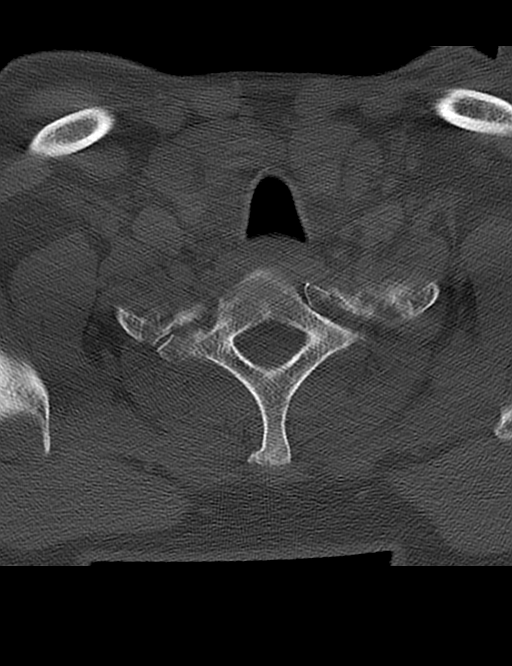
[im 50/99  bone]
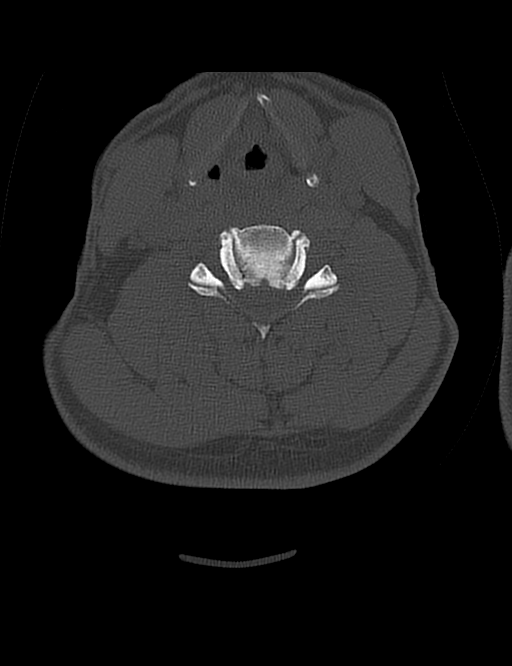
[im 74/99  bone]
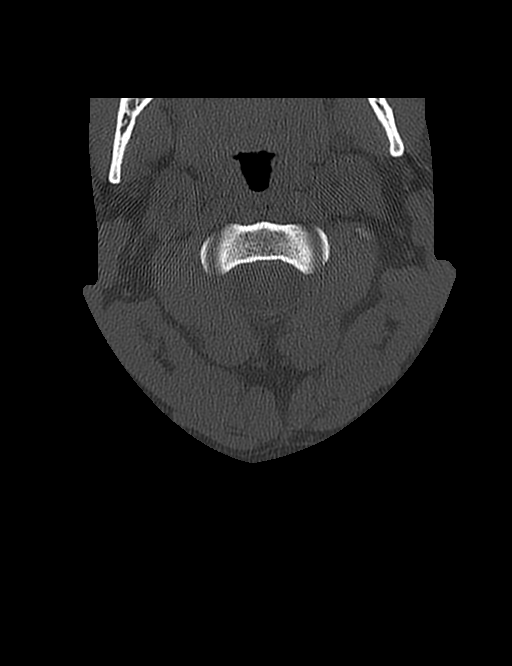

[Series 305: cor · coronal · 0.34mm/px · 3 of 38 slices shown]
[im 8/38  bone]
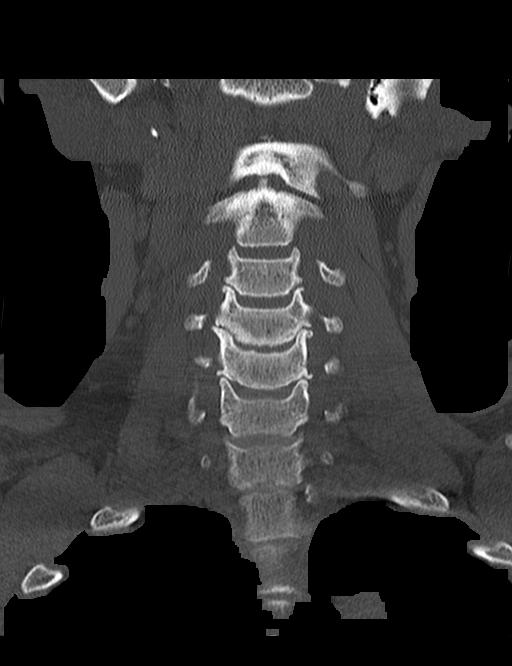
[im 15/38  bone]
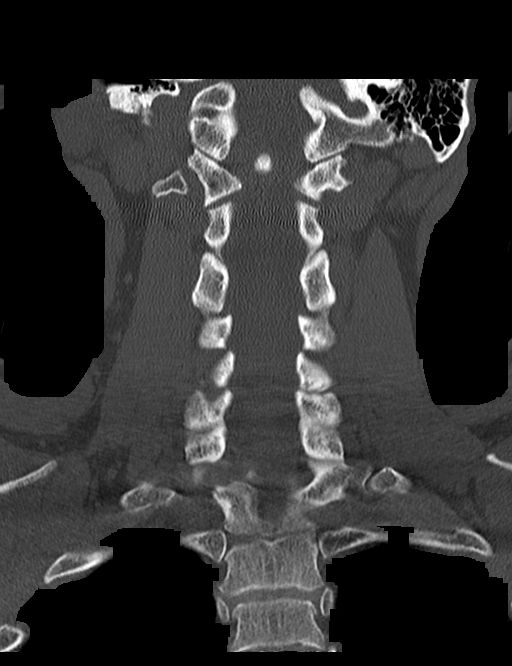
[im 23/38  bone]
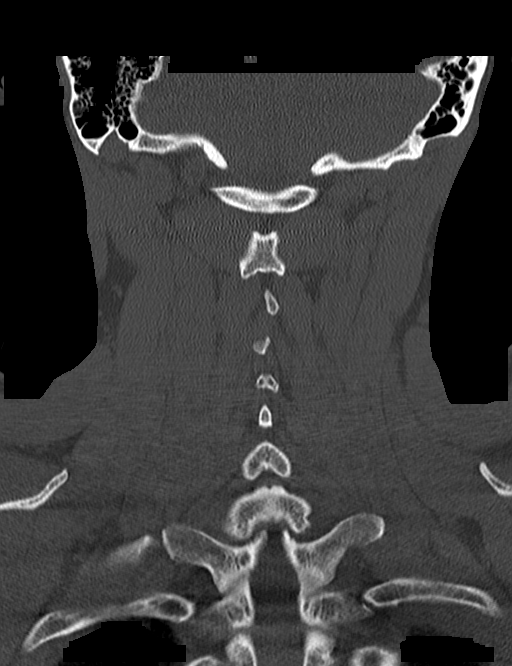

[Series 306: sag · sagittal · 0.34mm/px · 5 of 44 slices shown, 6 images]
[im 15/44  bone]
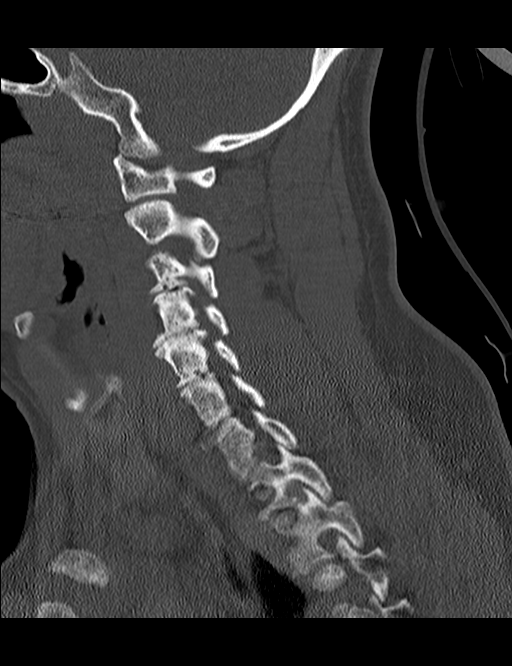
[im 18/44  bone]
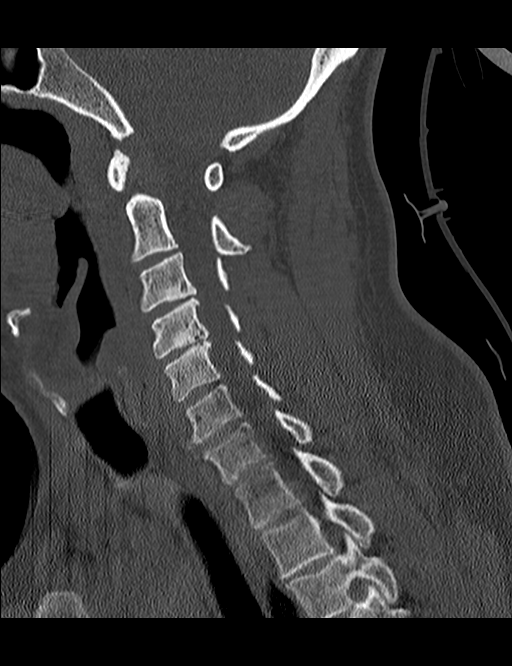
[im 22/44  soft-tissue]
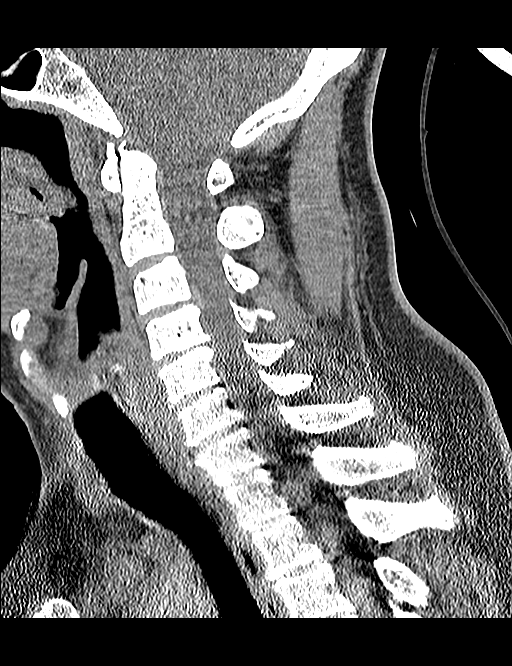
[im 22/44  bone]
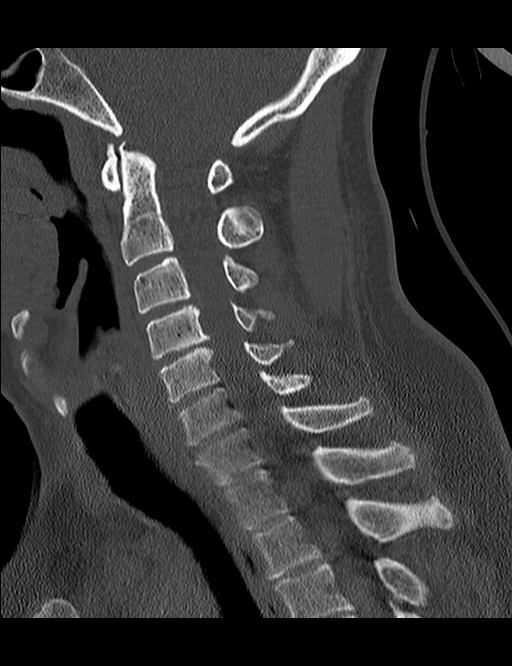
[im 26/44  bone]
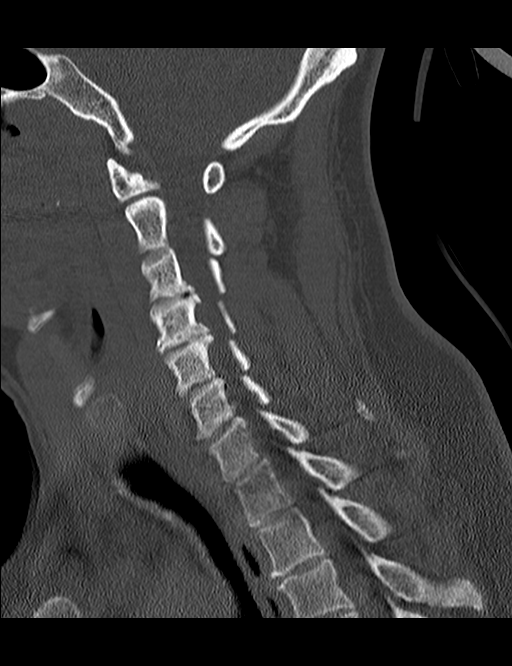
[im 29/44  bone]
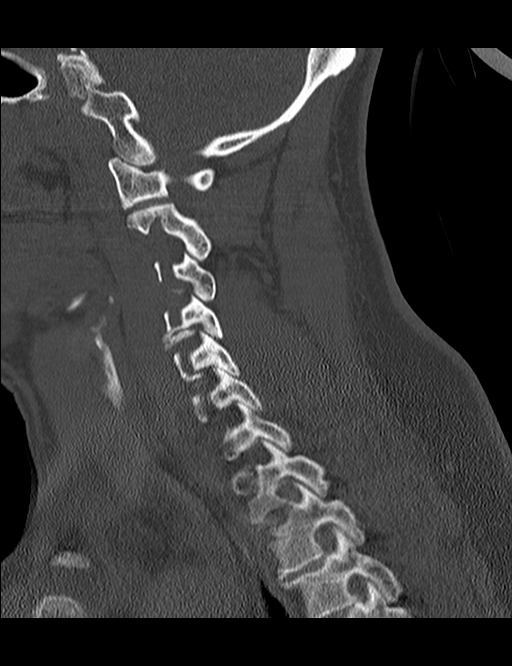

[11 of 33 positions shown; findings below may reference images not displayed]

FINDINGS: CT HEAD FINDINGS

There is no intra or extra-axial fluid collection or mass lesion.
The basilar cisterns and ventricles have a normal appearance. There
is no CT evidence for acute infarction or hemorrhage. Bone windows
are unremarkable.

CT CERVICAL SPINE FINDINGS

There are degenerative changes in the mid cervical spine. No
evidence for acute fracture or subluxation. Alignment is normal.
Lung apices are clear.
IMPRESSION: 1.  No evidence for acute intracranial abnormality.
2.  No evidence for acute cervical spine abnormality.
3. Mild cervical spine degenerative changes.

## 2016-11-24 ENCOUNTER — Telehealth: Payer: Self-pay | Admitting: Gastroenterology

## 2016-11-25 NOTE — Telephone Encounter (Signed)
Patient called back to schedule but [facility] date has expired. He will call the [facility] to get updated auth and dates. We will schedule when this has been received.

## 2016-11-25 NOTE — Telephone Encounter (Signed)
Dr. Havery Moros reviewed records and accept patient for an OV.  Called and LM on Vmail to CB to schedule

## 2017-01-21 ENCOUNTER — Encounter (INDEPENDENT_AMBULATORY_CARE_PROVIDER_SITE_OTHER): Payer: Self-pay

## 2017-01-21 ENCOUNTER — Ambulatory Visit (INDEPENDENT_AMBULATORY_CARE_PROVIDER_SITE_OTHER): Payer: Non-veteran care | Admitting: Gastroenterology

## 2017-01-21 ENCOUNTER — Encounter: Payer: Self-pay | Admitting: Gastroenterology

## 2017-01-21 VITALS — BP 150/102 | HR 79 | Ht 65.0 in | Wt 191.0 lb

## 2017-01-21 DIAGNOSIS — K5909 Other constipation: Secondary | ICD-10-CM

## 2017-01-21 DIAGNOSIS — Z1211 Encounter for screening for malignant neoplasm of colon: Secondary | ICD-10-CM

## 2017-01-21 MED ORDER — LINACLOTIDE 145 MCG PO CAPS: 145.0000 ug | ORAL_CAPSULE | Freq: Every day | ORAL | 0 refills | Status: AC

## 2017-01-21 MED ORDER — PEG-KCL-NACL-NASULF-NA ASC-C 140 G PO SOLR: 1.0000 | Freq: Once | ORAL | 0 refills | Status: AC

## 2017-01-21 NOTE — Patient Instructions (Signed)
If you are age 51 or older, your body mass index should be between 23-30. Your Body mass index is 31.78 kg/m. If this is out of the aforementioned range listed, please consider follow up with your Primary Care Provider.  If you are age 28 or younger, your body mass index should be between 19-25. Your Body mass index is 31.78 kg/m. If this is out of the aformentioned range listed, please consider follow up with your Primary Care Provider.   You have been scheduled for a colonoscopy. Please follow written instructions given to you at your visit today.  Please pick up your prep supplies at the pharmacy within the next 1-3 days. If you use inhalers (even only as needed), please bring them with you on the day of your procedure. Your physician has requested that you go to www.startemmi.com and enter the access code given to you at your visit today. This web site gives a general overview about your procedure. However, you should still follow specific instructions given to you by our office regarding your preparation for the procedure.  We have given you samples of the following medication to take: Linzess, 140mcg. Take once a day.  Can increase to twice a day if needed.  Note: while you are taking Linzess stop taking Miralax and senna.  Thank you for entrusting me with your care, Dr. Vanderbilt Cellar

## 2017-01-21 NOTE — Progress Notes (Signed)
HPI :  51 y/o male with history of renal stones, HTN, HLD, here for new patient visit for chronic constipation and colon cancer screening. He receives most of his care at the [facility].   He reports roughly 1 year of chronic constipation. He states he felt he became constipated when his prostate started causing him problems. 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 He reports a sense of incomplete evacuation, associated with some straining. More recently has been having a bowel movement once a day but states it still feels like he cannot evacuate himself completely. He has been using MiraLAX and Senokot which does help a little bit but not completely relieve his symptoms. There is no blood in the stool. He denies any abdominal pains with this. He is eating okay and denies any weight loss. He has occasional nausea but no vomiting. He denies any family history of colon cancer. He denies any prior colon cancer screening. He denies any routine use of alcohol.     Past Medical History:  Diagnosis Date  . Depression   . History of kidney stones   . Hypercholesteremia   . Hypertension   . Prostate hyperplasia with urinary obstruction      Past Surgical History:  Procedure Laterality Date  . KIDNEY STONE SURGERY    . NO PAST SURGERIES     History reviewed. No pertinent family history. Social History   Tobacco Use  . Smoking status: Former Smoker    Types: Cigarettes    Last attempt to quit: 01/07/1984    Years since quitting: 33.0  . Smokeless tobacco: Never Used  Substance Use Topics  . Alcohol use: Yes    Alcohol/week: 6.6 oz    Types: 8 Cans of beer, 3 Shots of liquor per week    Comment: OCCASIONAL  . Drug use: Yes    Frequency: 1.0 times per week    Types: Marijuana   Current Outpatient Medications  Medication Sig Dispense Refill  . ARIPiprazole (ABILIFY) 10 MG tablet Take 5 mg by mouth daily.     Marland Kitchen atorvastatin (LIPITOR) 40 MG tablet Take 20 mg by mouth daily.    . DULoxetine (CYMBALTA) 30 MG capsule Take 90 mg by mouth daily.    . finasteride (PROSCAR) 5 MG tablet Take 1 tablet (5 mg total) by mouth daily. For BPH 30 tablet 3  . losartan (COZAAR) 100 MG tablet Take 100 mg by mouth daily.  0  . Multiple Vitamin (MULTIVITAMIN WITH MINERALS) TABS tablet Take 1 tablet by mouth daily. Megan Men    . polyethylene glycol (MIRALAX) packet Take 17 g by mouth daily. 14 each 0  . senna-docusate (SENOKOT S) 8.6-50 MG tablet Take 2 tablets by mouth at bedtime. For constipation, available over the counter 60 tablet 0  . traMADol (ULTRAM) 50 MG tablet Take 1 tablet (50 mg total) by mouth every 8 (eight) hours as needed for moderate pain or severe pain. 20 tablet 0  . traZODone (DESYREL) 100 MG tablet Take 100 mg by mouth at bedtime.     No current facility-administered medications for this visit.    No Known Allergies   Review of Systems: All systems reviewed and negative except where noted in HPI.   Lab Results  Component Value Date   WBC 13.9 (H) 10/12/2015   HGB 12.9 (L) 10/12/2015   HCT 38.9 (L) 10/12/2015   MCV 85.1 10/12/2015  PLT 207 10/12/2015    Lab Results  Component Value Date   CREATININE 0.64 10/12/2015   BUN 6 10/12/2015   NA 136 10/12/2015   K 3.6 10/12/2015   CL 102 10/12/2015   CO2 26 10/12/2015    Lab Results  Component Value Date   ALT 23 10/10/2015   AST 19 10/10/2015   ALKPHOS 76 10/10/2015   BILITOT 1.3 (H) 10/10/2015     Physical Exam: BP (!) 150/102   Pulse 79   Ht 5\' 5"  (1.651 m)   Wt 191 lb (86.6 kg)   BMI 31.78 kg/m  Constitutional: Pleasant,well-developed, male in no acute distress. HEENT: Normocephalic and atraumatic. Conjunctivae are normal. No scleral icterus. Neck supple.  Cardiovascular: Normal rate, regular rhythm.  Pulmonary/chest: Effort normal and breath sounds normal. No wheezing, rales or rhonchi. Abdominal: Soft,  nondistended, protuberant. . There are no masses palpable. No hepatomegaly. Extremities: no edema Lymphadenopathy: No cervical adenopathy noted. Neurological: Alert and oriented to person place and time. Skin: Skin is warm and dry. No rashes noted. Psychiatric: Normal mood and affect. Behavior is normal.   ASSESSMENT AND PLAN: 51 year old male with 1 year of chronic constipation/sense of inability to completely evacuate himself, no prior colon cancer screening.  I'm recommending optical colonoscopy to perform his screening and to evaluate his symptoms. Following discussion of the risks and benefits of colonoscopy and anesthesia he wanted to proceed. I offered him the first available time slot within a week, he preferred to do this in the next 2-3 weeks. In the interim I discussed options for his constipation treatment. He can try increasing MiraLAX to higher dosing however wishes to try an alternative regimen. In this setting I offered him a trial of Linzess 183mcg once daily. I provided him a free sample. After a few days if he does not have a good result, he can increase to 215mcg. If this is not helping he can call be back and will try something else. Otherwise will await his colonoscopy to be done in the near future. All questions answered, he agreed with the plan.   Peabody Cellar, MD [facility] Pager [phone]

## 2017-02-02 ENCOUNTER — Other Ambulatory Visit: Payer: Self-pay

## 2017-02-02 ENCOUNTER — Encounter: Payer: Self-pay | Admitting: Gastroenterology

## 2017-02-02 ENCOUNTER — Ambulatory Visit (AMBULATORY_SURGERY_CENTER): Payer: Non-veteran care | Admitting: Gastroenterology

## 2017-02-02 VITALS — BP 110/68 | HR 73 | Temp 97.8°F | Resp 16 | Ht 65.0 in | Wt 191.0 lb

## 2017-02-02 DIAGNOSIS — D122 Benign neoplasm of ascending colon: Secondary | ICD-10-CM | POA: Diagnosis not present

## 2017-02-02 DIAGNOSIS — K5909 Other constipation: Secondary | ICD-10-CM | POA: Diagnosis not present

## 2017-02-02 MED ORDER — SODIUM CHLORIDE 0.9 % IV SOLN: 500.0000 mL | Freq: Once | INTRAVENOUS | Status: DC

## 2017-02-02 NOTE — Progress Notes (Signed)
Pt's states no medical or surgical changes since previsit or office visit. 

## 2017-02-02 NOTE — Progress Notes (Signed)
Called to room to assist during endoscopic procedure.  Patient ID and intended procedure confirmed with present staff. Received instructions for my participation in the procedure from the performing physician.  

## 2017-02-02 NOTE — Progress Notes (Signed)
A/ox3 pleased with MAC, report to Levada Dy RN    Patient ID: Matthew Small, male   DOB: [birth year], 52 y.o.   MRN: [id number]

## 2017-02-02 NOTE — Op Note (Signed)
[place] Patient Name: Matthew Small Procedure Date: 02/02/2017 10:26 AM MRN: [id number] Endoscopist: Remo Lipps P. Armbruster MD, MD Age: 52 Referring MD:  Date of Birth: 04-01-1965 Gender: Male Account #: [id number] Procedure:                Colonoscopy Indications:              This is the patient's first colonoscopy,                            Constipation / bowel habit changes - improved with                            trial of Linzess Medicines:                Monitored Anesthesia Care Procedure:                Pre-Anesthesia Assessment:                           - Prior to the procedure, a History and Physical                            was performed, and patient medications and                            allergies were reviewed. The patient's tolerance of                            previous anesthesia was also reviewed. The risks                            and benefits of the procedure and the sedation                            options and risks were discussed with the patient.                            All questions were answered, and informed consent                            was obtained. Prior Anticoagulants: The patient has                            taken no previous anticoagulant or antiplatelet                            agents. ASA Grade Assessment: II - A patient with                            mild systemic disease. After reviewing the risks                            and benefits, the patient was deemed in  satisfactory condition to undergo the procedure.                           After obtaining informed consent, the colonoscope                            was passed under direct vision. Throughout the                            procedure, the patient's blood pressure, pulse, and                            oxygen saturations were monitored continuously. The                            Colonoscope was introduced through the anus  and                            advanced to the the terminal ileum, with                            identification of the appendiceal orifice and IC                            valve. The colonoscopy was performed without                            difficulty. The patient tolerated the procedure                            well. The quality of the bowel preparation was                            good. The terminal ileum, ileocecal valve,                            appendiceal orifice, and rectum were photographed. Scope In: 10:30:53 AM Scope Out: 10:42:51 AM Scope Withdrawal Time: 0 hours 10 minutes 18 seconds  Total Procedure Duration: 0 hours 11 minutes 58 seconds  Findings:                 The perianal and digital rectal examinations were                            normal.                           The terminal ileum appeared normal.                           A 3 mm polyp was found in the ascending colon. The                            polyp was sessile. The polyp was removed with a  cold snare. Resection and retrieval were complete.                           Internal hemorrhoids were found during retroflexion.                           The exam was otherwise without abnormality. Complications:            No immediate complications. Estimated blood loss:                            Minimal. Estimated Blood Loss:     Estimated blood loss was minimal. Impression:               - The examined portion of the ileum was normal.                           - One 3 mm polyp in the ascending colon, removed                            with a cold snare. Resected and retrieved.                           - Internal hemorrhoids.                           - The examination was otherwise normal. Recommendation:           - Patient has a contact number available for                            emergencies. The signs and symptoms of potential                            delayed  complications were discussed with the                            patient. Return to normal activities tomorrow.                            Written discharge instructions were provided to the                            patient.                           - Resume previous diet.                           - Continue present medications.                           - Await pathology results.                           - Repeat colonoscopy is recommended for  surveillance. The colonoscopy date will be                            determined after pathology results from today's                            exam become available for review.                           - continue Linzess as needed for constipation Remo Lipps P. Armbruster MD, MD 02/02/2017 10:46:08 AM This report has been signed electronically.

## 2017-02-02 NOTE — Patient Instructions (Signed)
**  Handouts given to patient for polyps**   YOU HAD AN ENDOSCOPIC PROCEDURE TODAY AT [facility]:   Refer to the procedure report that was given to you for any specific questions about what was found during the examination.  If the procedure report does not answer your questions, please call your gastroenterologist to clarify.  If you requested that your care partner not be given the details of your procedure findings, then the procedure report has been included in a sealed envelope for you to review at your convenience later.  YOU SHOULD EXPECT: Some feelings of bloating in the abdomen. Passage of more gas than usual.  Walking can help get rid of the air that was put into your GI tract during the procedure and reduce the bloating. If you had a lower endoscopy (such as a colonoscopy or flexible sigmoidoscopy) you may notice spotting of blood in your stool or on the toilet paper. If you underwent a bowel prep for your procedure, you may not have a normal bowel movement for a few days.  Please Note:  You might notice some irritation and congestion in your nose or some drainage.  This is from the oxygen used during your procedure.  There is no need for concern and it should clear up in a day or so.  SYMPTOMS TO REPORT IMMEDIATELY:   Following lower endoscopy (colonoscopy or flexible sigmoidoscopy):  Excessive amounts of blood in the stool  Significant tenderness or worsening of abdominal pains  Swelling of the abdomen that is new, acute  Fever of 100F or higher   For urgent or emergent issues, a gastroenterologist can be reached at any hour by calling [phone].   DIET:  We do recommend a small meal at first, but then you may proceed to your regular diet.  Drink plenty of fluids but you should avoid alcoholic beverages for 24 hours.  ACTIVITY:  You should plan to take it easy for the rest of today and you should NOT DRIVE or use heavy machinery until tomorrow (because of  the sedation medicines used during the test).    FOLLOW UP: Our staff will call the number listed on your records the next business day following your procedure to check on you and address any questions or concerns that you may have regarding the information given to you following your procedure. If we do not reach you, we will leave a message.  However, if you are feeling well and you are not experiencing any problems, there is no need to return our call.  We will assume that you have returned to your regular daily activities without incident.  If any biopsies were taken you will be contacted by phone or by letter within the next 1-3 weeks.  Please call us at [phone] if you have not heard about the biopsies in 3 weeks.    SIGNATURES/CONFIDENTIALITY: You and/or your care partner have signed paperwork which will be entered into your electronic medical record.  These signatures attest to the fact that that the information above on your After Visit Summary has been reviewed and is understood.  Full responsibility of the confidentiality of this discharge information lies with you and/or your care-partner.

## 2017-02-03 ENCOUNTER — Telehealth: Payer: Self-pay

## 2017-02-03 NOTE — Telephone Encounter (Signed)
  Follow up Call-  Call back number 02/02/2017  Post procedure Call Back phone  # [phone]  Permission to leave phone message Yes  Some recent data might be hidden     Patient questions:  Do you have a fever, pain , or abdominal swelling? No. Pain Score  0 *  Have you tolerated food without any problems? Yes.    Have you been able to return to your normal activities? Yes.    Do you have any questions about your discharge instructions: Diet   No. Medications  No. Follow up visit  No.  Do you have questions or concerns about your Care? No.  Actions: * If pain score is 4 or above: No action needed, pain <4.

## 2017-02-08 ENCOUNTER — Encounter: Payer: Self-pay | Admitting: Gastroenterology

## 2017-08-16 IMAGING — CR DG CHEST 2V
3 series · 3 of 3 positions shown · non-contrast
Comparison: 10/03/2015

CLINICAL DATA: Fever, body aches and chest pain suggest today.

EXAM:
CHEST  2 VIEW

[chest pa (1 of 2)]
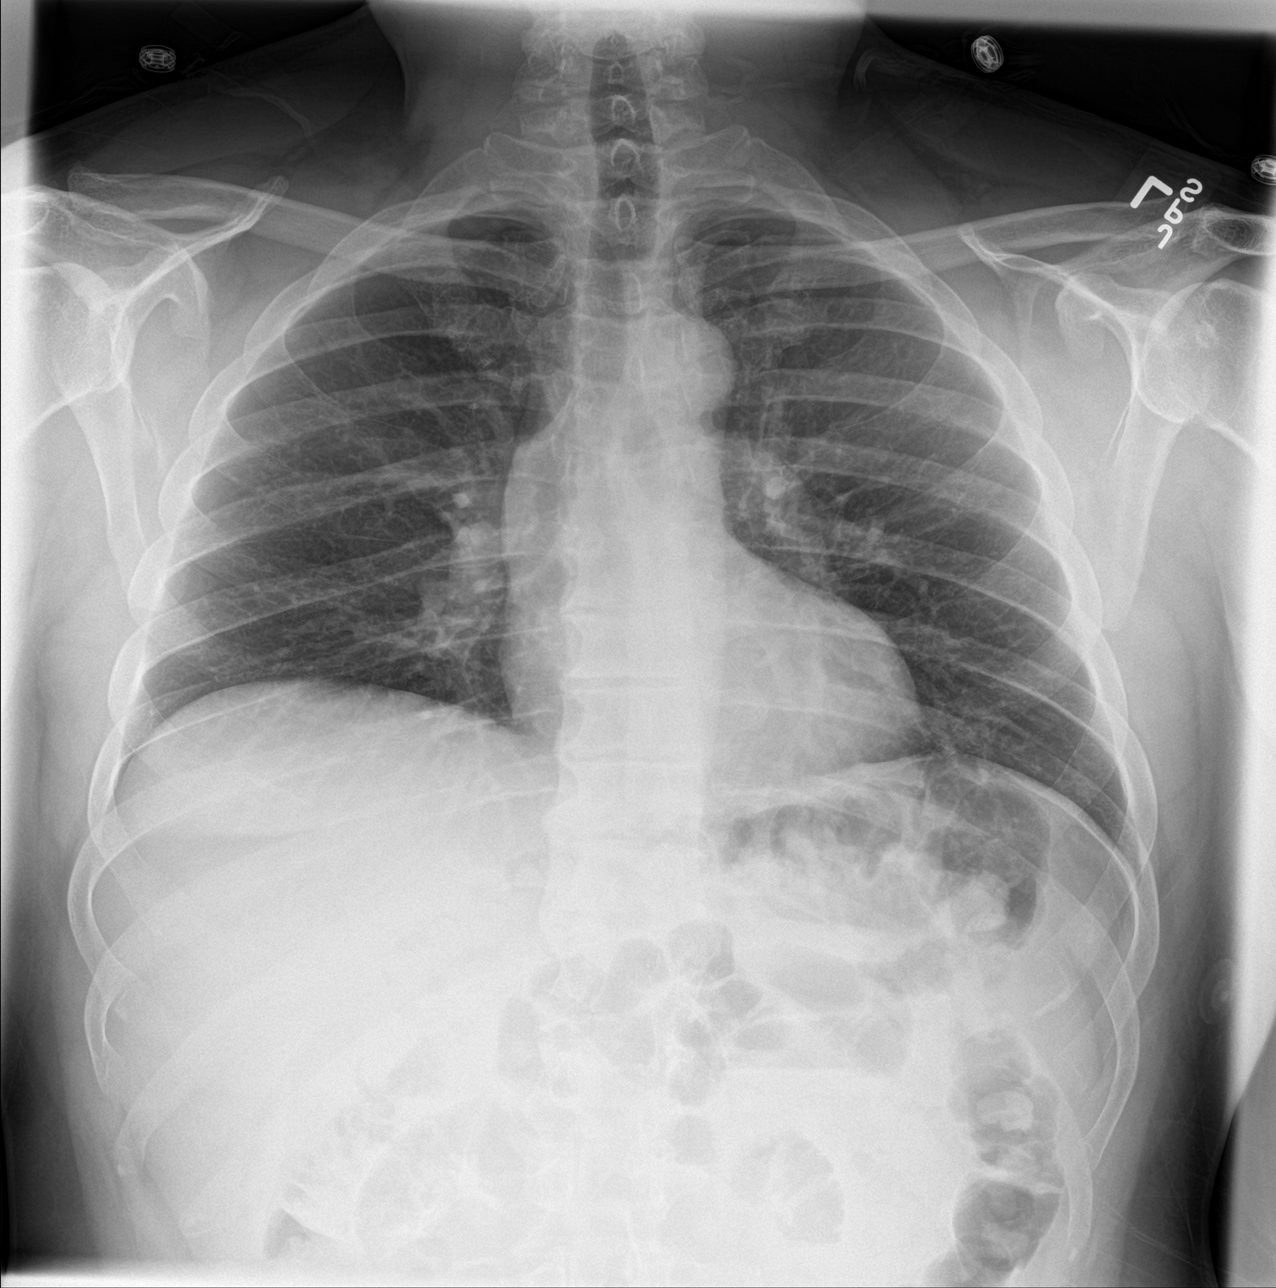

[chest lat]
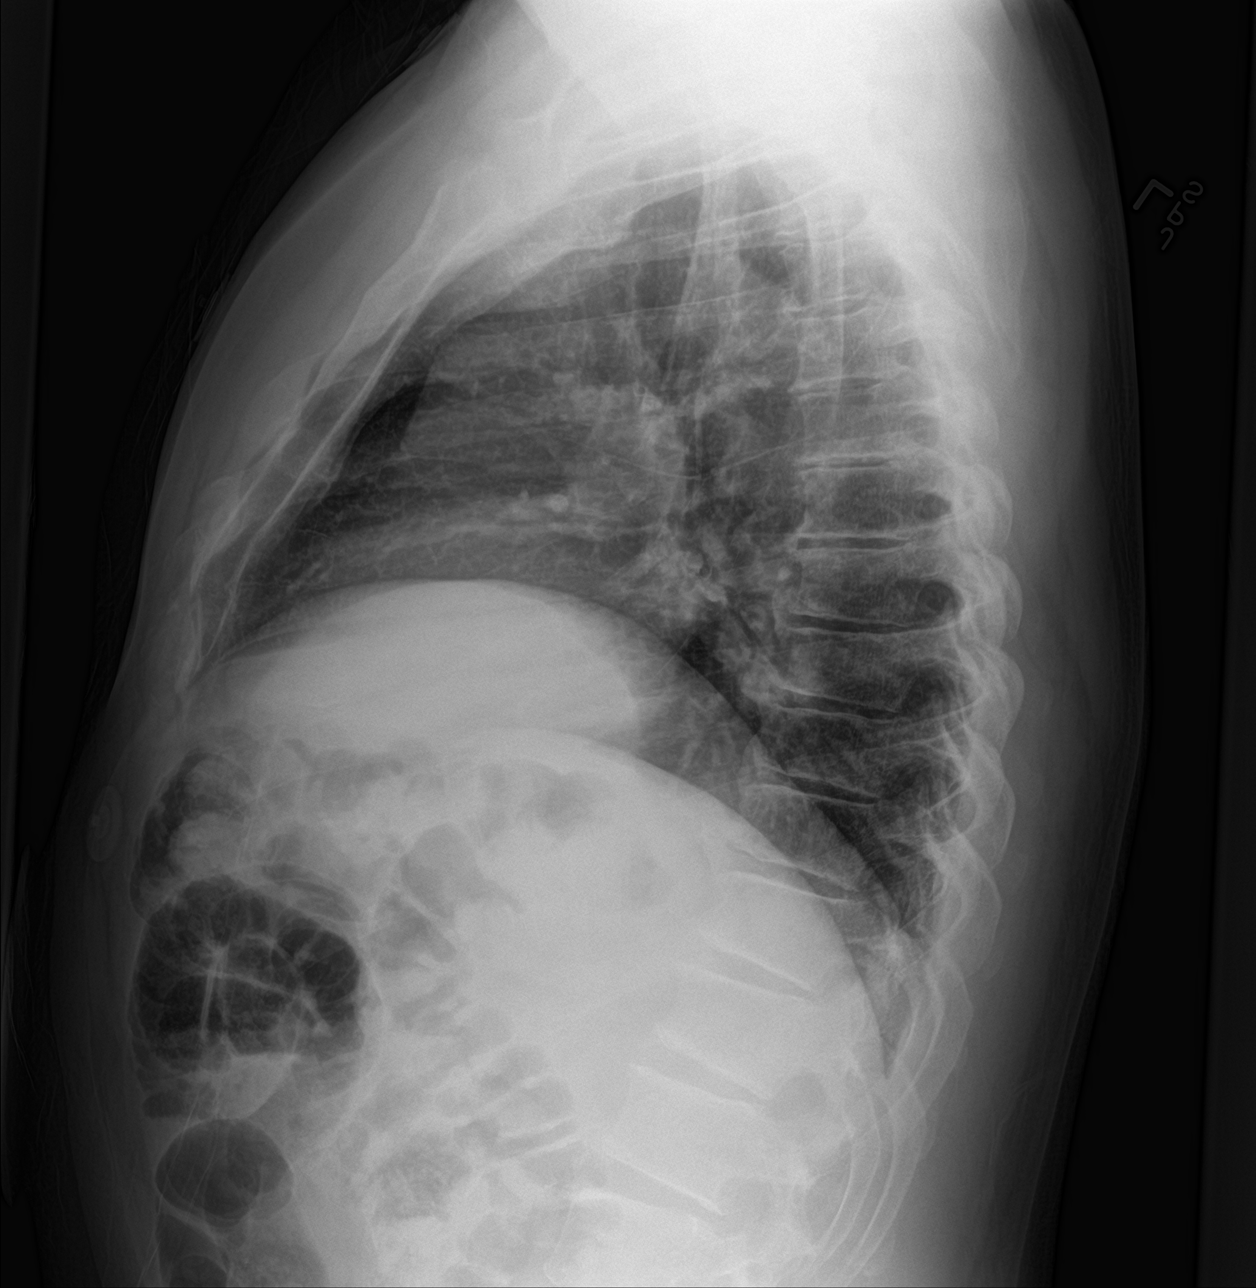

[chest pa (2 of 2)]
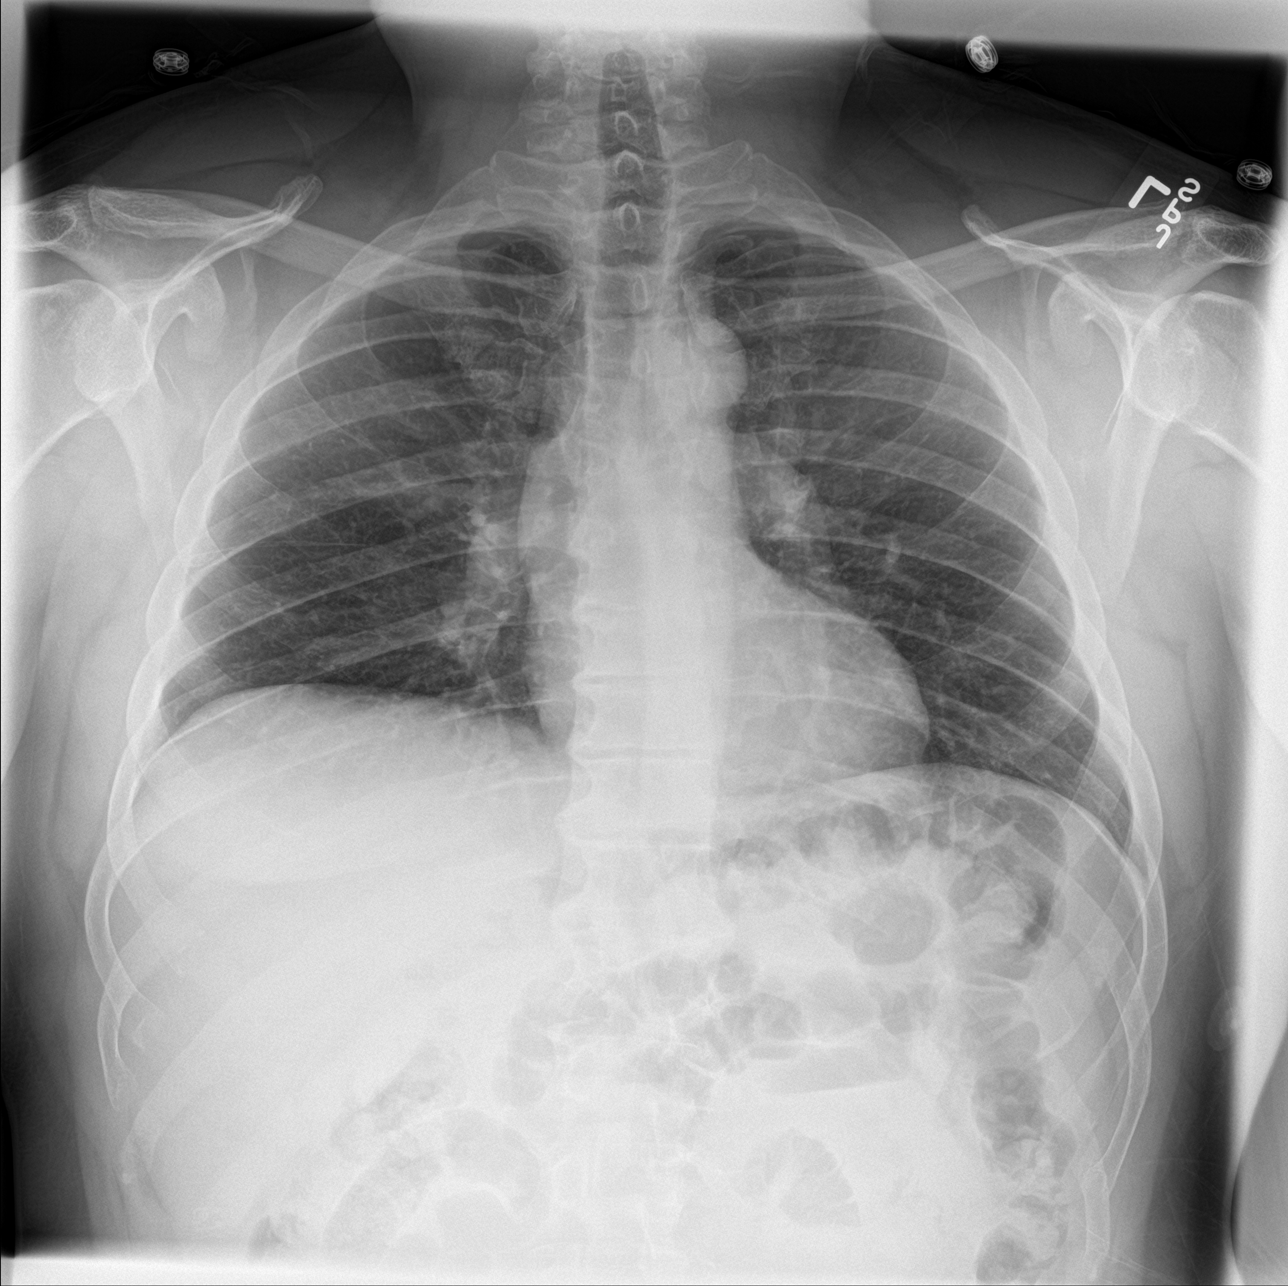

[3 of 3 positions shown; findings below may reference images not displayed]

FINDINGS: The heart size and mediastinal contours are within normal limits.
Both lungs are clear. The visualized skeletal structures are
unremarkable.
IMPRESSION: Normal chest x-ray.

## 2019-03-19 ENCOUNTER — Ambulatory Visit: Payer: Non-veteran care | Attending: Internal Medicine

## 2019-03-19 DIAGNOSIS — Z23 Encounter for immunization: Secondary | ICD-10-CM | POA: Insufficient documentation

## 2019-03-19 NOTE — Progress Notes (Signed)
   Covid-19 Vaccination Clinic  Name:  Madyx Blinder    MRN: [id number] DOB: [birth year]  03/19/2019  Mr. Mulcahey was observed post Covid-19 immunization for 15 minutes without incidence. He was provided with Vaccine Information Sheet and instruction to access the V-Safe system.   Mr. Pyatt was instructed to call 911 with any severe reactions post vaccine: Marland Kitchen Difficulty breathing  . Swelling of your face and throat  . A fast heartbeat  . A bad rash all over your body  . Dizziness and weakness    Immunizations Administered    Name Date Dose VIS Date Route   Moderna COVID-19 Vaccine 03/19/2019 10:47 AM 0.5 mL 12/26/2018 Intramuscular   Manufacturer: Moderna   LotUZ:[id number]   [id number]

## 2019-04-17 ENCOUNTER — Ambulatory Visit: Payer: Non-veteran care | Attending: Internal Medicine

## 2019-04-17 DIAGNOSIS — Z23 Encounter for immunization: Secondary | ICD-10-CM

## 2019-04-17 NOTE — Progress Notes (Signed)
   Covid-19 Vaccination Clinic  Name:  Matthew Small    MRN: [id number] DOB: 1965/07/16  04/17/2019  Matthew Small was observed post Covid-19 immunization for 15 minutes without incident. He was provided with Vaccine Information Sheet and instruction to access the V-Safe system.   Matthew Small was instructed to call 911 with any severe reactions post vaccine: Marland Kitchen Difficulty breathing  . Swelling of face and throat  . A fast heartbeat  . A bad rash all over body  . Dizziness and weakness   Immunizations Administered    Name Date Dose VIS Date Route   Moderna COVID-19 Vaccine 04/17/2019 10:32 AM 0.5 mL 12/26/2018 Intramuscular   Manufacturer: Levan Hurst   [place]otUT:[id number]   [id number]

## 2019-08-20 ENCOUNTER — Other Ambulatory Visit: Payer: Self-pay

## 2019-08-20 ENCOUNTER — Emergency Department (HOSPITAL_COMMUNITY)
Admission: EM | Admit: 2019-08-20 | Discharge: 2019-08-20 | Disposition: A | Discharge status: Home / Self Care | Payer: No Typology Code available for payment source | Attending: Emergency Medicine | Admitting: Emergency Medicine

## 2019-08-20 ENCOUNTER — Emergency Department (HOSPITAL_COMMUNITY): Payer: No Typology Code available for payment source

## 2019-08-20 DIAGNOSIS — R1030 Lower abdominal pain, unspecified: Secondary | ICD-10-CM | POA: Diagnosis present

## 2019-08-20 DIAGNOSIS — F1590 Other stimulant use, unspecified, uncomplicated: Secondary | ICD-10-CM | POA: Insufficient documentation

## 2019-08-20 DIAGNOSIS — I10 Essential (primary) hypertension: Secondary | ICD-10-CM | POA: Insufficient documentation

## 2019-08-20 DIAGNOSIS — K5900 Constipation, unspecified: Secondary | ICD-10-CM | POA: Diagnosis not present

## 2019-08-20 DIAGNOSIS — Z87891 Personal history of nicotine dependence: Secondary | ICD-10-CM | POA: Insufficient documentation

## 2019-08-20 DIAGNOSIS — Z79899 Other long term (current) drug therapy: Secondary | ICD-10-CM | POA: Diagnosis not present

## 2019-08-20 DIAGNOSIS — K625 Hemorrhage of anus and rectum: Secondary | ICD-10-CM | POA: Diagnosis not present

## 2019-08-20 LAB — LIPASE, BLOOD: Lipase: 26 U/L (ref 11–51)

## 2019-08-20 LAB — URINALYSIS, ROUTINE W REFLEX MICROSCOPIC
Bacteria, UA: NONE SEEN
Bilirubin Urine: NEGATIVE
Glucose, UA: NEGATIVE mg/dL
Ketones, ur: NEGATIVE mg/dL
Leukocytes,Ua: NEGATIVE
Nitrite: NEGATIVE
Protein, ur: NEGATIVE mg/dL
Specific Gravity, Urine: 1.023 (ref 1.005–1.030)
pH: 5 (ref 5.0–8.0)

## 2019-08-20 LAB — COMPREHENSIVE METABOLIC PANEL
ALT: 32 U/L (ref 0–44)
AST: 31 U/L (ref 15–41)
Albumin: 3.8 g/dL (ref 3.5–5.0)
Alkaline Phosphatase: 101 U/L (ref 38–126)
Anion gap: 11 (ref 5–15)
BUN: 15 mg/dL (ref 6–20)
CO2: 24 mmol/L (ref 22–32)
Calcium: 8.9 mg/dL (ref 8.9–10.3)
Chloride: 103 mmol/L (ref 98–111)
Creatinine, Ser: 0.85 mg/dL (ref 0.61–1.24)
GFR calc Af Amer: >60 mL/min (ref >60)
GFR calc non Af Amer: >60 mL/min (ref >60)
Glucose, Bld: 106 mg/dL — ABNORMAL HIGH (ref 70–99)
Potassium: 4.2 mmol/L (ref 3.5–5.1)
Sodium: 138 mmol/L (ref 135–145)
Total Bilirubin: 0.7 mg/dL (ref 0.3–1.2)
Total Protein: 6.8 g/dL (ref 6.5–8.1)

## 2019-08-20 LAB — TYPE AND SCREEN
ABO/RH(D): B POS
Antibody Screen: NEGATIVE

## 2019-08-20 LAB — CBC
HCT: 50.7 % (ref 39.0–52.0)
Hemoglobin: 16.8 g/dL (ref 13.0–17.0)
MCH: 28.9 pg (ref 26.0–34.0)
MCHC: 33.1 g/dL (ref 30.0–36.0)
MCV: 87.3 fL (ref 80.0–100.0)
Platelets: 238 10*3/uL (ref 150–400)
RBC: 5.81 MIL/uL (ref 4.22–5.81)
RDW: 12.1 % (ref 11.5–15.5)
WBC: 7.9 10*3/uL (ref 4.0–10.5)
nRBC: 0 % (ref 0.0–0.2)

## 2019-08-20 LAB — POC OCCULT BLOOD, ED: Fecal Occult Bld: POSITIVE — AB

## 2019-08-20 MED ORDER — ONDANSETRON HCL 4 MG/2ML IJ SOLN
4.0000 mg | Freq: Once | INTRAMUSCULAR | Status: AC
  Administered 2019-08-20: 4 mg via INTRAVENOUS
  Filled 2019-08-20: qty 2

## 2019-08-20 MED ORDER — HYDROCORTISONE ACETATE 25 MG RE SUPP
25.0000 mg | Freq: Two times a day (BID) | RECTAL | 0 refills | Status: AC
Start: 2019-08-20 — End: ?

## 2019-08-20 MED ORDER — HYDROCODONE-ACETAMINOPHEN 5-325 MG PO TABS: 1.0000 | ORAL_TABLET | ORAL | 0 refills | Status: AC | PRN

## 2019-08-20 MED ORDER — OXYCODONE-ACETAMINOPHEN 5-325 MG PO TABS
1.0000 | ORAL_TABLET | Freq: Once | ORAL | Status: AC
  Administered 2019-08-20: 1 via ORAL
  Filled 2019-08-20: qty 1

## 2019-08-20 MED ORDER — HYDROMORPHONE HCL 1 MG/ML IJ SOLN
1.0000 mg | Freq: Once | INTRAMUSCULAR | Status: AC
  Administered 2019-08-20: 1 mg via INTRAVENOUS
  Filled 2019-08-20: qty 1

## 2019-08-20 MED ORDER — IOHEXOL 300 MG/ML  SOLN
100.0000 mL | Freq: Once | INTRAMUSCULAR | Status: AC | PRN
  Administered 2019-08-20: 100 mL via INTRAVENOUS

## 2019-08-20 MED ORDER — SODIUM CHLORIDE 0.9% FLUSH
3.0000 mL | Freq: Once | INTRAVENOUS | Status: AC
  Administered 2019-08-20: 3 mL via INTRAVENOUS

## 2019-08-20 NOTE — ED Provider Notes (Signed)
[facility] EMERGENCY DEPARTMENT Provider Note   CSN: [id number] Arrival date & time: 08/20/19  9470     History Chief Complaint  Patient presents with  . Rectal Bleeding  . Abdominal Pain    Matthew Small is a 54 y.o. male.  HPI He complains of decreased bowel movements for 1 week, and feels like he is constipated.  This morning he sat down had a large volume rectal bleed, when attempting to have a bowel movement.  He has felt warm and generally weak, without focal weakness or paresthesia.  History of constipation in the past.  He usually takes MiraLAX but "ran out."  He denies fever, chills, cough, shortness of breath.  There are no other known modifying factors.    Past Medical History:  Diagnosis Date  . Depression   . History of kidney stones   . Hypercholesteremia   . Hypertension   . Prostate hyperplasia with urinary obstruction     Patient Active Problem List   Diagnosis Date Noted  . Sepsis ([place]) 10/10/2015  . UTI (lower urinary tract infection) 10/10/2015  . BPH (benign prostatic hypertrophy) 10/10/2015  . Hematuria 10/10/2015  . Lactic acidosis 10/10/2015  . Leukocytosis 10/10/2015  . Hypertension 10/10/2015  . Anxiety 10/10/2015    Past Surgical History:  Procedure Laterality Date  . KIDNEY STONE SURGERY    . NO PAST SURGERIES         No family history on file.  Social History   Tobacco Use  . Smoking status: Former Smoker    Types: Cigarettes    Quit date: 01/07/1984    Years since quitting: 35.6  . Smokeless tobacco: Never Used  Substance Use Topics  . Alcohol use: Yes    Alcohol/week: 11.0 standard drinks    Types: 8 Cans of beer, 3 Shots of liquor per week    Comment: OCCASIONAL  . Drug use: Yes    Frequency: 1.0 times per week    Types: Marijuana    Home Medications Prior to Admission medications   Medication Sig Start Date End Date Taking? Authorizing Provider  atorvastatin (LIPITOR) 40 MG tablet Take 20 mg  by mouth daily.   Yes [provider]  DULoxetine (CYMBALTA) 30 MG capsule Take 90 mg by mouth daily.   Yes [provider]  losartan (COZAAR) 100 MG tablet Take 100 mg by mouth daily. 11/28/13  Yes [provider]  PRESCRIPTION MEDICATION Take 1 capsule by mouth at bedtime. For anxiety and sleep.   Yes [provider]  traZODone (DESYREL) 100 MG tablet Take 100 mg by mouth at bedtime.   Yes [provider]  ARIPiprazole (ABILIFY) 10 MG tablet Take 5 mg by mouth daily. Patient not taking: Reported on 08/20/2019    [provider]  finasteride (PROSCAR) 5 MG tablet Take 1 tablet (5 mg total) by mouth daily. For BPH Patient not taking: Reported on 08/20/2019 10/12/15   Rai, Vernelle Emerald, MD  HYDROcodone-acetaminophen (NORCO/VICODIN) 5-325 MG tablet Take 1 tablet by mouth every 4 (four) hours as needed. 08/20/19   Daleen Bo, MD  hydrocortisone (ANUSOL-HC) 25 MG suppository Place 1 suppository (25 mg total) rectally 2 (two) times daily. For 7 days 08/20/19   Daleen Bo, MD  linaclotide [facility]) 145 MCG CAPS capsule Take 1 capsule (145 mcg total) by mouth daily before breakfast. Can increase to twice a day as needed. Patient not taking: Reported on 08/20/2019 01/21/17   Armbruster, Carlota Raspberry, MD  Multiple Vitamin (MULTIVITAMIN WITH MINERALS) TABS tablet Take 1 tablet by mouth daily. Megan Men Patient not taking: Reported on 08/20/2019    [provider]  polyethylene glycol (MIRALAX) packet Take 17 g by mouth daily. Patient not taking: Reported on 08/20/2019 10/03/15   Ezequiel Essex, MD  senna-docusate (SENOKOT S) 8.6-50 MG tablet Take 2 tablets by mouth at bedtime. For constipation, available over the counter Patient not taking: Reported on 08/20/2019 10/12/15   Rai, Vernelle Emerald, MD  traMADol (ULTRAM) 50 MG tablet Take 1 tablet (50 mg total) by mouth every 8 (eight) hours as needed for moderate pain or severe pain. Patient not taking:  Reported on 02/02/2017 10/12/15   Mendel Corning, MD    Allergies    Patient has no known allergies.  Review of Systems   Review of Systems  All other systems reviewed and are negative.   Physical Exam Updated Vital Signs BP (!) 170/109 (BP Location: Right Arm) Comment: pt states he has a hx of high blood pressure and hasnt taken his medication today.   Pulse 57   Temp 98.1 F (36.7 C) (Oral)   Resp 16   SpO2 97%   Physical Exam Vitals and nursing note reviewed.  Constitutional:      General: He is not in acute distress.    Appearance: He is well-developed. He is obese. He is not ill-appearing, toxic-appearing or diaphoretic.  HENT:     Head: Normocephalic and atraumatic.     Right Ear: External ear normal.     Left Ear: External ear normal.  Eyes:     Conjunctiva/sclera: Conjunctivae normal.     Pupils: Pupils are equal, round, and reactive to light.  Neck:     Trachea: Phonation normal.  Cardiovascular:     Rate and Rhythm: Normal rate and regular rhythm.     Heart sounds: Normal heart sounds.  Pulmonary:     Effort: Pulmonary effort is normal.     Breath sounds: Normal breath sounds.  Abdominal:     Palpations: Abdomen is soft.     Tenderness: There is abdominal tenderness (Mid lower, mild).  Genitourinary:    Comments: Well appearing anus and external tissues.  No evidence for anal fissure.  On digital rectal exam, rectal tissues are somewhat boggy and he is extremely tender.  There is no palpable internal hemorrhoid or mass.  There is a small amount of brown stool in the rectal vault.  No visualized red blood. Musculoskeletal:        General: Normal range of motion.     Cervical back: Normal range of motion and neck supple.  Skin:    General: Skin is warm and dry.  Neurological:     Mental Status: He is alert and oriented to person, place, and time.     Cranial Nerves: No cranial nerve deficit.     Sensory: No sensory deficit.     Motor: No abnormal muscle  tone.     Coordination: Coordination normal.  Psychiatric:        Mood and Affect: Mood normal.        Behavior: Behavior normal.        Thought Content: Thought content normal.        Judgment: Judgment normal.     ED Results / Procedures / Treatments   Labs (all labs ordered are listed, but only abnormal results are displayed) Labs Reviewed  COMPREHENSIVE METABOLIC PANEL - Abnormal; Notable for the following components:  Result Value   Glucose, Bld 106 (*)    All other components within normal limits  URINALYSIS, ROUTINE W REFLEX MICROSCOPIC - Abnormal; Notable for the following components:   Hgb urine dipstick MODERATE (*)    All other components within normal limits  POC OCCULT BLOOD, ED - Abnormal; Notable for the following components:   Fecal Occult Bld POSITIVE (*)    All other components within normal limits  CBC  LIPASE, BLOOD  TYPE AND SCREEN    EKG None  Radiology CT Abdomen Pelvis W Contrast  Result Date: 08/20/2019 CLINICAL DATA:  Right red blood per rectum. EXAM: CT ABDOMEN AND PELVIS WITH CONTRAST TECHNIQUE: Multidetector CT imaging of the abdomen and pelvis was performed using the standard protocol following bolus administration of intravenous contrast. CONTRAST:  125mL OMNIPAQUE IOHEXOL 300 MG/ML  SOLN COMPARISON:  October 10, 2015. FINDINGS: Lower chest: No acute abnormality. Hepatobiliary: No gallstones or biliary dilatation is noted. Hepatic steatosis is noted. Probable small cyst seen posteriorly in right hepatic lobe. Pancreas: Unremarkable. No pancreatic ductal dilatation or surrounding inflammatory changes. Spleen: Normal in size without focal abnormality. Adrenals/Urinary Tract: Adrenal glands are unremarkable. Kidneys are normal, without renal calculi, focal lesion, or hydronephrosis. Bladder is unremarkable. Stomach/Bowel: The stomach appears normal. There is no evidence of bowel obstruction or inflammation. The appendix is not visualized, but no  inflammation is noted in the right lower quadrant. Vascular/Lymphatic: No significant vascular findings are present. No enlarged abdominal or pelvic lymph nodes. Reproductive: Prostate is unremarkable. Other: No abdominal wall hernia or abnormality. No abdominopelvic ascites. Musculoskeletal: No acute or significant osseous findings. IMPRESSION: 1. Hepatic steatosis. 2. No other abnormality seen in the abdomen or pelvis. Electronically Signed   By: Marijo Conception M.D.   On: 08/20/2019 14:25    Procedures Procedures (including critical care time)  Medications Ordered in ED Medications  sodium chloride flush (NS) 0.9 % injection 3 mL (3 mLs Intravenous Given 08/20/19 1252)  HYDROmorphone (DILAUDID) injection 1 mg (1 mg Intravenous Given 08/20/19 1248)  ondansetron (ZOFRAN) injection 4 mg (4 mg Intravenous Given 08/20/19 1248)  iohexol (OMNIPAQUE) 300 MG/ML solution 100 mL (100 mLs Intravenous Contrast Given 08/20/19 1403)  oxyCODONE-acetaminophen (PERCOCET/ROXICET) 5-325 MG per tablet 1 tablet (1 tablet Oral Given 08/20/19 1610)    ED Course  I have reviewed the triage vital signs and the nursing notes.  Pertinent labs & imaging results that were available during my care of the patient were reviewed by me and considered in my medical decision making (see chart for details).  Clinical Course as of Aug 19 1800  Mon Aug 20, 2019  1533 Abnormal, positive  POC occult blood, ED(!) [EW]  1533 Normal  Lipase, blood [EW]  1533 Normal  CBC [EW]  1533 Normal except presence of blood  Urinalysis, Routine w reflex microscopic(!) [EW]  1533 Normal except glucose high  Comprehensive metabolic panel(!) [EW]  5093 Per radiologist, no acute intra-abdominal abnormalities.  CT Abdomen Pelvis W Contrast [EW]    Clinical Course User Index [EW] Daleen Bo, MD   MDM Rules/Calculators/A&P                           Patient Vitals for the past 24 hrs:  BP Temp Temp src Pulse Resp SpO2  08/20/19 1613  (!) 170/109 98.1 F (36.7 C) Oral 57 16 97 %  08/20/19 1530 (!) 139/92 -- -- 58 16 96 %  08/20/19 1330 (!)  142/85 -- -- 57 -- --  08/20/19 1329 -- -- -- -- -- 92 %  08/20/19 1300 (!) 145/91 -- -- 56 18 91 %  08/20/19 1221 -- -- -- -- -- 95 %  08/20/19 0842 (!) 132/92 98.1 F (36.7 C) Oral 73 18 100 %    At discharge- reevaluation with update and discussion. After initial assessment and treatment, an updated evaluation reveals he is comfortable has no further complaints.  Findings discussed and questions answered. Daleen Bo   Medical Decision Making:  This patient is presenting for evaluation of abdominal discomfort and rectal bleeding, which does require a range of treatment options, and is a complaint that involves a high risk of morbidity and mortality. The differential diagnoses include colitis, hemorrhoids, anal fissure, complications from constipation. I decided to review old records, and in summary patient with history of constipation, currently with decreased appetite and stooling for 1 week now with new onset of rectal bleeding in the last 24 hours..  I did not require additional historical information from anyone.  Clinical Laboratory Tests Ordered, included CBC, Metabolic panel, Urinalysis and Urinalysis, fecal occult blood testing. Review indicates reassuring findings, small amount of blood in urine, fecal occult blood positive.  Otherwise normal. Radiologic Tests Ordered, included CT abdomen pelvis.  I independently Visualized: CT images, which show no findings    Critical Interventions-clinical evaluation, laboratory testing, analgesia treatment given, IV fluids given CT imaging, observation reassessment  After These Interventions, the Patient was reevaluated and was found stable for discharge.  Patient had normal rectal exam, brown stool fecal occult positive.  CT imaging is reassuring does not show proctitis, colitis, serious intra-abdominal abnormalities.  Suspect  inflammatory otitis, causing bleeding or possibly internal hemorrhoids.  He is hemodynamically stable.  No indication for further ED intervention or hospitalization.Marland Kitchen  CRITICAL CARE-no Performed by: Daleen Bo  Nursing Notes Reviewed/ Care Coordinated Applicable Imaging Reviewed Interpretation of Laboratory Data incorporated into ED treatment  The patient appears reasonably screened and/or stabilized for discharge and I doubt any other medical condition or other [facility] requiring further screening, evaluation, or treatment in the ED at this time prior to discharge.  Plan: Home Medications-prescriptions as below, continue usual; Home Treatments-gradual advance diet and activity; return here if the recommended treatment, does not improve the symptoms; Recommended follow up-GI follow-up 1 to 2 weeks.     Final Clinical Impression(s) / ED Diagnoses Final diagnoses:  Rectal bleeding    Rx / DC Orders ED Discharge Orders         Ordered    HYDROcodone-acetaminophen (NORCO/VICODIN) 5-325 MG tablet  Every 4 hours PRN     Discontinue  Reprint     08/20/19 1607    hydrocortisone (ANUSOL-HC) 25 MG suppository  2 times daily     Discontinue  Reprint     08/20/19 1607           Daleen Bo, MD 08/20/19 [phone]

## 2019-08-20 NOTE — Discharge Instructions (Signed)
It appears that inflammation in your rectum is causing the pain and the bleeding.  We are giving him suppositories to decrease the inflammation and hopefully stop the discomfort.  Also we are giving you a narcotic pain reliever to take if needed.  Do not drive or drink alcohol within 4 hours of taking the pain reliever.  You can try soaking in a bathtub 2 or 3 times a day to help your discomfort.  Try to eat a high-fiber diet, to help you have bowel movements, and drink 2 L of water each day.  Call the GI doctor for follow-up appointment to be seen for further care and treatment.

## 2019-08-20 NOTE — ED Triage Notes (Signed)
Pt here with complaints of constipation X6 days. States onset yesterday large amounts of BRBPR. Pt also with generalized abdominal pain. Denies NV.

## 2022-02-08 ENCOUNTER — Encounter: Payer: Self-pay | Admitting: Gastroenterology

## 2024-02-04 ENCOUNTER — Encounter: Payer: Self-pay | Admitting: Gastroenterology
